# Patient Record
Sex: Female | Born: 1980 | Race: White | Hispanic: No | Marital: Married | Smoking: Never smoker
Health system: Southern US, Community
[De-identification: ages and names within clinical notes are randomized; demographics above are authoritative.]

## PROBLEM LIST (undated history)

## (undated) DIAGNOSIS — I10 Essential (primary) hypertension: Secondary | ICD-10-CM

## (undated) DIAGNOSIS — O09512 Supervision of elderly primigravida, second trimester: Secondary | ICD-10-CM

## (undated) DIAGNOSIS — E119 Type 2 diabetes mellitus without complications: Secondary | ICD-10-CM

## (undated) DIAGNOSIS — O1495 Unspecified pre-eclampsia, complicating the puerperium: Secondary | ICD-10-CM

## (undated) DIAGNOSIS — K219 Gastro-esophageal reflux disease without esophagitis: Secondary | ICD-10-CM

## (undated) HISTORY — DX: Type 2 diabetes mellitus without complications: E11.9

## (undated) HISTORY — DX: Gastro-esophageal reflux disease without esophagitis: K21.9

## (undated) HISTORY — DX: Essential (primary) hypertension: I10

## (undated) HISTORY — DX: Supervision of elderly primigravida, second trimester: O09.512

## (undated) HISTORY — PX: TYMPANOSTOMY TUBE PLACEMENT: SHX32

---

## 2017-06-02 LAB — HM PAP SMEAR: HM Pap smear: NEGATIVE

## 2017-07-05 ENCOUNTER — Inpatient Hospital Stay
Admission: RE | Admit: 2017-07-05 | Discharge: 2017-07-05 | Disposition: A | Discharge status: Home / Self Care | Payer: Self-pay | Source: Ambulatory Visit | Attending: *Deleted | Admitting: *Deleted

## 2017-07-05 ENCOUNTER — Other Ambulatory Visit: Payer: Self-pay | Admitting: *Deleted

## 2017-07-05 DIAGNOSIS — Z9289 Personal history of other medical treatment: Secondary | ICD-10-CM

## 2017-07-06 ENCOUNTER — Other Ambulatory Visit: Payer: Self-pay | Admitting: Women's Health

## 2017-07-06 DIAGNOSIS — Z1231 Encounter for screening mammogram for malignant neoplasm of breast: Secondary | ICD-10-CM

## 2017-07-08 ENCOUNTER — Ambulatory Visit
Admission: RE | Admit: 2017-07-08 | Discharge: 2017-07-08 | Disposition: A | Discharge status: Home / Self Care | Payer: 59 | Source: Ambulatory Visit | Attending: Women's Health | Admitting: Women's Health

## 2017-07-08 ENCOUNTER — Other Ambulatory Visit: Payer: Self-pay | Admitting: Women's Health

## 2017-07-08 DIAGNOSIS — Z1231 Encounter for screening mammogram for malignant neoplasm of breast: Secondary | ICD-10-CM

## 2017-08-04 ENCOUNTER — Encounter: Payer: Self-pay | Admitting: Radiology

## 2017-08-04 ENCOUNTER — Ambulatory Visit
Admission: RE | Admit: 2017-08-04 | Discharge: 2017-08-04 | Disposition: A | Discharge status: Home / Self Care | Payer: 59 | Source: Ambulatory Visit | Attending: Women's Health | Admitting: Women's Health

## 2017-08-04 DIAGNOSIS — Z1231 Encounter for screening mammogram for malignant neoplasm of breast: Secondary | ICD-10-CM | POA: Insufficient documentation

## 2017-08-31 ENCOUNTER — Ambulatory Visit (INDEPENDENT_AMBULATORY_CARE_PROVIDER_SITE_OTHER): Payer: 59 | Admitting: Primary Care

## 2017-08-31 ENCOUNTER — Encounter: Payer: Self-pay | Admitting: Primary Care

## 2017-08-31 VITALS — BP 122/76 | HR 87 | Temp 98.3°F | Wt 170.0 lb

## 2017-08-31 DIAGNOSIS — E119 Type 2 diabetes mellitus without complications: Secondary | ICD-10-CM | POA: Diagnosis not present

## 2017-08-31 DIAGNOSIS — I10 Essential (primary) hypertension: Secondary | ICD-10-CM | POA: Diagnosis not present

## 2017-08-31 MED ORDER — GLUCOSE BLOOD VI STRP: 1.0000 | ORAL_STRIP | Freq: Two times a day (BID) | 12 refills | Status: DC

## 2017-08-31 MED ORDER — LISINOPRIL 10 MG PO TABS: 10.0000 mg | ORAL_TABLET | Freq: Every day | ORAL | 1 refills | Status: DC

## 2017-08-31 MED ORDER — METFORMIN HCL 500 MG PO TABS: 500.0000 mg | ORAL_TABLET | Freq: Every day | ORAL | 1 refills | Status: DC

## 2017-08-31 NOTE — Progress Notes (Signed)
Subjective:    Patient ID: Rachel Rice, female    DOB: 1981-04-04, 37 y.o.   MRN: [id number]  HPI  [name] is a 37 year old female who presents today to establish care and discuss the problems mentioned below. Will obtain old records.   1) Type 2 Diabetes: Diagnosed in 2016. Currently managed on Metformin 500 mg once daily. Previously taking Metformin twice daily at one point but was decreased to one tablet once daily do to good glycemia control. She has to crush her metformin tablet as she has difficulty swallowing pills. Her last A1C was in November 2018 and she thinks it was 6.1 or 6.2.   She checks her blood sugar twice daily on average:  Fasting in the AM: 115-125 typically, recently has been 130-140's After lunch or after dinner: 115-125, recently running 130's-140's.   She has been under additional stress as she's recently moved, purchased a new home, and is adjusting to working from home.  2) Essential Hypertension: Currently managed on lisinopril 10 mg. She checks her BP at home and sees numbers of 120's/70-80's. She denies chest pain, dizziness, shortness of breath.   BP Readings from Last 3 Encounters:  08/31/17 122/76     Review of Systems  Constitutional: Negative for fatigue.  Respiratory: Negative for shortness of breath.   Cardiovascular: Negative for chest pain.  Neurological: Negative for dizziness, numbness and headaches.  Psychiatric/Behavioral:       Increased stress, handles well on her own.       Past Medical History:  Diagnosis Date  . Diabetes mellitus without complication ([place])   . GERD (gastroesophageal reflux disease)   . Hypertension      Social History   Socioeconomic History  . Marital status: Married    Spouse name: Not on file  . Number of children: Not on file  . Years of education: Not on file  . Highest education level: Not on file  Social Needs  . Financial resource strain: Not on file  . Food insecurity - worry: Not on  file  . Food insecurity - inability: Not on file  . Transportation needs - medical: Not on file  . Transportation needs - non-medical: Not on file  Occupational History  . Not on file  Tobacco Use  . Smoking status: Never Smoker  . Smokeless tobacco: Never Used  Substance and Sexual Activity  . Alcohol use: Yes    Comment: rare  . Drug use: No  . Sexual activity: Not on file  Other Topics Concern  . Not on file  Social History Narrative   Married.    No Children.   Claims adjuster.    Past Surgical History:  Procedure Laterality Date  . TYMPANOSTOMY TUBE PLACEMENT      Family History  Problem Relation Age of Onset  . Ovarian cancer Paternal Grandmother 74  . Hypertension Mother   . Hypertension Father   . Diabetes Father        secondary to agent orange exposure  . Hypertension Brother   . Heart disease Paternal Grandfather   . Hypertension Brother   . Breast cancer Neg Hx     Allergies  Allergen Reactions  . Contrast Media [Iodinated Diagnostic Agents] Hives  . Fluticasone Other (See Comments)    nosebleed nosebleed   . Latex Rash    Current Outpatient Medications on File Prior to Visit  Medication Sig Dispense Refill  . Blood Glucose Monitoring Suppl (ONE TOUCH ULTRA 2)  w/Device KIT by Does not apply route.    . Prenatal MV & Min w/FA-DHA (PRENATAL ADULT GUMMY/DHA/FA PO) Take 2 each by mouth daily.    . Probiotic Product (PROBIOTIC-10) CAPS Take 1 capsule by mouth daily.     No current facility-administered medications on file prior to visit.     BP 122/76   Pulse 87   Temp 98.3 F (36.8 C) (Oral)   Wt 170 lb (77.1 kg)   LMP 08/07/2017   SpO2 98%    Objective:   Physical Exam  Constitutional: She appears well-nourished.  Neck: Neck supple.  Cardiovascular: Normal rate and regular rhythm.  Pulmonary/Chest: Effort normal and breath sounds normal.  Skin: Skin is warm and dry.  Psychiatric: She has a normal mood and affect.            Assessment & Plan:

## 2017-08-31 NOTE — Assessment & Plan Note (Addendum)
Endorses A1C of 6.1 in November 2018. Managed on ACE.  Will repeat labs this June 2019, include lipid panel. Check foot exam next visit.  Continue to monitor blood sugars, work on stress reduction. Continue metformin 500 mg once daily for now.

## 2017-08-31 NOTE — Patient Instructions (Signed)
I sent refills for lisinopril and metformin to your pharmacy.  Please schedule a follow up appointment for June 2019, set up a lab appointment 1-2 days prior, make sure your fasting (no food 4 hours prior).   It was a pleasure to meet you today! Please don't hesitate to call or message me with any questions. Welcome to [facility]!

## 2017-08-31 NOTE — Assessment & Plan Note (Signed)
Stable in the office today, continue lisinopril 10 mg. Will obtain records for BMP. Repeat labs this June 2019.

## 2017-09-02 ENCOUNTER — Telehealth: Payer: Self-pay | Admitting: Primary Care

## 2017-09-02 NOTE — Telephone Encounter (Signed)
Rec'd from Dr. Delcie Rochyan Light & Dr. Donna Christenracy Papp forwarded 89 pages to Vernona Riegerlark Katherine NP

## 2017-09-08 ENCOUNTER — Encounter: Payer: Self-pay | Admitting: Primary Care

## 2017-09-09 ENCOUNTER — Ambulatory Visit: Payer: Self-pay | Admitting: *Deleted

## 2017-09-09 ENCOUNTER — Other Ambulatory Visit: Payer: Self-pay | Admitting: Primary Care

## 2017-09-09 DIAGNOSIS — I10 Essential (primary) hypertension: Secondary | ICD-10-CM

## 2017-09-09 MED ORDER — LABETALOL HCL 100 MG PO TABS: 100.0000 mg | ORAL_TABLET | Freq: Two times a day (BID) | ORAL | 0 refills | Status: DC

## 2017-09-09 NOTE — Telephone Encounter (Signed)
Please notify patient that we'll start her on labetalol 100 mg twice daily for her high blood pressure. Have her monitor her BP once daily, same arm, around the same time everyday.  I'll need to see her back in the office in 2 weeks for BP check, please schedule. Have her bring BP logs to her visit.

## 2017-09-09 NOTE — Telephone Encounter (Signed)
Patient just found out she is pregnant and she made a new patient appointment with her OB-GYN- they told her to stop her Lisinopril without starting her on an alternative. She has not had a new patient appointment yet. Patient BP yesterday was 129/99 and 121/92. Her BP today is 125/88 and now it is 147/105. She does report a slight headache.  Reason for Disposition . Ran out of BP medications  Answer Assessment - Initial Assessment Questions 1. BLOOD PRESSURE: "What is the blood pressure?" "Did you take at least two measurements 5 minutes apart?"     147/105 2. ONSET: "When did you take your blood pressure?"     today 3. HOW: "How did you obtain the blood pressure?" (e.g., visiting nurse, automatic home BP monitor)      4. HISTORY: "Do you have a history of high blood pressure?"    yes 5. MEDICATIONS: "Are you taking any medications for blood pressure?" "Have you missed any doses recently?"     Patient stopped blood pressure medication Wednesday- 2 doses 6. OTHER SYMPTOMS: "Do you have any symptoms?" (e.g., headache, chest pain, blurred vision, difficulty breathing, weakness)     Slight headache 7. PREGNANCY: "Is there any chance you are pregnant?" "When was your last menstrual period?"     yes  Protocols used: HIGH BLOOD PRESSURE-A-AH

## 2017-09-09 NOTE — Telephone Encounter (Signed)
I called the pt and she stated she was aware of the message below.  Has a follow up appt in 2 weeks.

## 2017-09-13 ENCOUNTER — Other Ambulatory Visit: Payer: Self-pay | Admitting: Primary Care

## 2017-09-13 MED ORDER — ACCU-CHEK AVIVA PLUS W/DEVICE KIT: PACK | 0 refills | Status: AC

## 2017-09-13 MED ORDER — ACCU-CHEK SOFT TOUCH LANCETS MISC: 5 refills | Status: DC

## 2017-09-13 MED ORDER — GLUCOSE BLOOD VI STRP: ORAL_STRIP | 12 refills | Status: DC

## 2017-09-13 NOTE — Telephone Encounter (Signed)
Received faxed refill request from the pharmacy that insurance prefer accu-chek  Send meter, test strips, and lancets to [facility]

## 2017-09-14 MED ORDER — ACCU-CHEK SOFTCLIX LANCETS MISC: 5 refills | Status: DC

## 2017-09-14 NOTE — Addendum Note (Signed)
Addended by: Tawnya CrookSAMBATH, Joselynne Killam on: 09/14/2017 11:04 AM   Modules accepted: Orders

## 2017-09-23 ENCOUNTER — Ambulatory Visit (INDEPENDENT_AMBULATORY_CARE_PROVIDER_SITE_OTHER): Payer: 59 | Admitting: Primary Care

## 2017-09-23 DIAGNOSIS — I10 Essential (primary) hypertension: Secondary | ICD-10-CM | POA: Diagnosis not present

## 2017-09-23 MED ORDER — LABETALOL HCL 100 MG PO TABS: 100.0000 mg | ORAL_TABLET | Freq: Two times a day (BID) | ORAL | 0 refills | Status: DC

## 2017-09-23 NOTE — Progress Notes (Signed)
Subjective:    Patient ID: Rachel Rice, female    DOB: [birth year], 37 y.o.   MRN: [id number]  HPI  Rachel Rice is a 37 year old female who presents today for follow up of hypertension.   Received phone call from patient on 09/09/17 with reports of recent pregnancy. She was discontinued from her lisinopril per GYN and started to notice elevated blood pressure readings of 147/105, 121/92. Also reported headaches. Given her history of hypertension and recent removal from anti-hypertensive she was treated with labetalol 100 mg BID and asked to follow up in our office.  BP Readings from Last 3 Encounters:  09/23/17 126/80  08/31/17 122/76   She's checking her BP at home and is getting readings of 110's-120's/70's-80's mostly. Her headaches have improved. She's been taking one tablet once daily over the past week as she started to notice lower readings. She has an appointment with her gynecologist next week.   Review of Systems  Eyes: Negative for visual disturbance.  Respiratory: Negative for shortness of breath.   Cardiovascular: Negative for chest pain.  Neurological: Negative for dizziness and headaches.       Past Medical History:  Diagnosis Date  . Diabetes mellitus without complication ([place])   . GERD (gastroesophageal reflux disease)   . Hypertension      Social History   Socioeconomic History  . Marital status: Married    Spouse name: Not on file  . Number of children: Not on file  . Years of education: Not on file  . Highest education level: Not on file  Occupational History  . Not on file  Social Needs  . Financial resource strain: Not on file  . Food insecurity:    Worry: Not on file    Inability: Not on file  . Transportation needs:    Medical: Not on file    Non-medical: Not on file  Tobacco Use  . Smoking status: Never Smoker  . Smokeless tobacco: Never Used  Substance and Sexual Activity  . Alcohol use: Yes    Comment: rare  . Drug use: No  .  Sexual activity: Not on file  Lifestyle  . Physical activity:    Days per week: Not on file    Minutes per session: Not on file  . Stress: Not on file  Relationships  . Social connections:    Talks on phone: Not on file    Gets together: Not on file    Attends religious service: Not on file    Active member of club or organization: Not on file    Attends meetings of clubs or organizations: Not on file    Relationship status: Not on file  . Intimate partner violence:    Fear of current or ex partner: Not on file    Emotionally abused: Not on file    Physically abused: Not on file    Forced sexual activity: Not on file  Other Topics Concern  . Not on file  Social History Narrative   Married.    No Children.   Claims adjuster.    Past Surgical History:  Procedure Laterality Date  . TYMPANOSTOMY TUBE PLACEMENT      Family History  Problem Relation Age of Onset  . Ovarian cancer Paternal Grandmother 67  . Hypertension Mother   . Hypertension Father   . Diabetes Father        secondary to agent orange exposure  . Hypertension Brother   . Heart disease  Paternal Grandfather   . Hypertension Brother   . Breast cancer Neg Hx     Allergies  Allergen Reactions  . Contrast Media [Iodinated Diagnostic Agents] Hives  . Fluticasone Other (See Comments)    nosebleed nosebleed   . Latex Rash    Current Outpatient Medications on File Prior to Visit  Medication Sig Dispense Refill  . ACCU-CHEK SOFTCLIX LANCETS lancets Use as instructed to test blood sugar 2 times daily 100 each 5  . Blood Glucose Monitoring Suppl (ACCU-CHEK AVIVA PLUS) w/Device KIT Use as instructed to test blood sugar daily 1 kit 0  . glucose blood (ACCU-CHEK AVIVA PLUS) test strip Use as instructed as instructed to test blood sugar 2 times daily 100 each 12  . metFORMIN (GLUCOPHAGE) 500 MG tablet Take 1 tablet (500 mg total) by mouth daily. 90 tablet 1  . Prenatal MV & Min w/FA-DHA (PRENATAL ADULT  GUMMY/DHA/FA PO) Take 2 each by mouth daily.    . Probiotic Product (PROBIOTIC-10) CAPS Take 1 capsule by mouth daily.     No current facility-administered medications on file prior to visit.     BP 126/80   Pulse 91   Temp 97.8 F (36.6 C) (Oral)   Wt 173 lb (78.5 kg)   SpO2 99%    Objective:   Physical Exam  Constitutional: She appears well-nourished.  Neck: Neck supple.  Cardiovascular: Normal rate and regular rhythm.  Pulmonary/Chest: Effort normal and breath sounds normal.  Skin: Skin is warm and dry.          Assessment & Plan:

## 2017-09-23 NOTE — Assessment & Plan Note (Signed)
Improved on labetalol 100 mg BID, is taking once daily now. Will continue once daily dosing to avoid hypotension. She will follow up with her gynecologist as scheduled.

## 2017-09-23 NOTE — Patient Instructions (Signed)
Continue taking labetalol once daily as discussed. Please notify your OB/GYN of this medication.  It was a pleasure to see you today! Congratulations!

## 2017-10-06 ENCOUNTER — Encounter: Payer: Self-pay | Admitting: *Deleted

## 2017-10-06 ENCOUNTER — Ambulatory Visit
Admission: RE | Admit: 2017-10-06 | Discharge: 2017-10-06 | Disposition: A | Discharge status: Home / Self Care | Payer: 59 | Source: Ambulatory Visit | Attending: Maternal & Fetal Medicine | Admitting: Maternal & Fetal Medicine

## 2017-10-06 VITALS — BP 131/94 | HR 96 | Temp 98.1°F | Ht 66.0 in | Wt 173.0 lb

## 2017-10-06 DIAGNOSIS — R112 Nausea with vomiting, unspecified: Secondary | ICD-10-CM | POA: Diagnosis not present

## 2017-10-06 DIAGNOSIS — O24111 Pre-existing diabetes mellitus, type 2, in pregnancy, first trimester: Secondary | ICD-10-CM | POA: Insufficient documentation

## 2017-10-06 DIAGNOSIS — I10 Essential (primary) hypertension: Secondary | ICD-10-CM

## 2017-10-06 DIAGNOSIS — Z3A01 Less than 8 weeks gestation of pregnancy: Secondary | ICD-10-CM | POA: Diagnosis not present

## 2017-10-06 DIAGNOSIS — Z7984 Long term (current) use of oral hypoglycemic drugs: Secondary | ICD-10-CM | POA: Insufficient documentation

## 2017-10-06 DIAGNOSIS — O24119 Pre-existing diabetes mellitus, type 2, in pregnancy, unspecified trimester: Secondary | ICD-10-CM | POA: Diagnosis not present

## 2017-10-06 DIAGNOSIS — O26891 Other specified pregnancy related conditions, first trimester: Secondary | ICD-10-CM | POA: Diagnosis not present

## 2017-10-06 DIAGNOSIS — O10011 Pre-existing essential hypertension complicating pregnancy, first trimester: Secondary | ICD-10-CM | POA: Insufficient documentation

## 2017-10-06 DIAGNOSIS — E119 Type 2 diabetes mellitus without complications: Secondary | ICD-10-CM

## 2017-10-06 DIAGNOSIS — O09521 Supervision of elderly multigravida, first trimester: Secondary | ICD-10-CM | POA: Insufficient documentation

## 2017-10-06 LAB — GLUCOSE, CAPILLARY: GLUCOSE-CAPILLARY: 163 mg/dL — AB (ref 65–99)

## 2017-10-06 NOTE — Progress Notes (Signed)
MFM Consultation  37 yo G1 at 8 weeks by LMP consistent with [place] on 09/27/17 (measurements c/w 7w 2d); EDD is 05/14/18.  She is referred for consultation due to Type 2 DM and chronic hypertension.  She reports diagnosis of DM 2 years ago during routine primary care visit surveillance.  She was placed on metformin '500mg'$  daily and has remained on that therapy.  She recently relocated from [place] (due to husband's work) and was without test strips and was unable to routinely check blood glucose values. She reports a HgbA1c of 6 in November, 2019. She denies history of end organ dysfunction. Chronic hypertension runs in her family (mother/father(due to agent orange)/brothers) and she was diagnosed in her 69s.  She reports negative renal artery ultrasounds and negative evaluations for other causes of hypertension.  She was previously on lisinopril and stopped when she became pregnant. She is here today with her husband.   Past Medical History:  Diagnosis Date  . Diabetes mellitus without complication ([place])   . GERD (gastroesophageal reflux disease)   . Hypertension     Past Surgical History:  Procedure Laterality Date  . TYMPANOSTOMY TUBE PLACEMENT      OB History  Gravida Para Term Preterm AB Living  1         0  SAB TAB Ectopic Multiple Live Births               # Outcome Date GA Lbr Len/2nd Weight Sex Delivery Anes PTL Lv  1 Current           no history of abnormal pap smear  Current Outpatient Medications on File Prior to Encounter  Medication Sig Dispense Refill  . ACCU-CHEK SOFTCLIX LANCETS lancets Use as instructed to test blood sugar 2 times daily 100 each 5  . Blood Glucose Monitoring Suppl (ACCU-CHEK AVIVA PLUS) w/Device KIT Use as instructed to test blood sugar daily 1 kit 0  . glucose blood (ACCU-CHEK AVIVA PLUS) test strip Use as instructed as instructed to test blood sugar 2 times daily 100 each 12  . labetalol (NORMODYNE) 100 MG tablet Take 1 tablet (100 mg total) by mouth 2  (two) times daily. For high blood pressure. (Patient taking differently: Take 100 mg by mouth once. For high blood pressure.) 180 tablet 0  . metFORMIN (GLUCOPHAGE) 500 MG tablet Take 1 tablet (500 mg total) by mouth daily. 90 tablet 1  . Prenatal MV & Min w/FA-DHA (PRENATAL ADULT GUMMY/DHA/FA PO) Take 2 each by mouth daily.    . Probiotic Product (PROBIOTIC-10) CAPS Take 1 capsule by mouth daily.     No current facility-administered medications on file prior to encounter.      Allergies  Allergen Reactions  . Contrast Media [Iodinated Diagnostic Agents] Hives  . Fluticasone Other (See Comments)    nosebleed nosebleed   . Latex Rash    Social history, married.  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   No tobacco use, etoh, or ilicit drugs.   Family History  Problem Relation Age of Onset  . Ovarian cancer Paternal Grandmother 17  . Hypertension Mother   . Hypertension Father   . Diabetes Father        secondary to agent orange exposure  . Hypertension Brother   . Heart disease Paternal Grandfather   . Hypertension Brother   . Breast cancer Neg Hx    Vitals:   10/06/17 0941  BP: (!) 131/94  Pulse: 96  Temp: 98.1 F (36.7 C)  SpO2: 99%   FHR 170s on BSUS   1. Hypertension in pregnancy. She is currently taking labetalol '100mg'$  daily.  She reports lower evening bps (asymptomatic, diastolic values in 03O systolic values 122-482) and had stopped evening dose for this reason. I advised her to continue the BID dosing.   We addressed concerns related to hypertensive disease include increased risks for superimposed preeclampsia, fetal growth restriction and other placenta mediated conditions such as placental abruption.  We discussed the lack of teratogenicity associated with ACE inhibitor exposure in the first trimester.  She was reassured with this information.   Recommendations --Continue labetalol  '100mg'$  bid---we addressed safety of labetalol use in pregnancy --Recommend low dose 'baby' aspirin after 12 weeks to decrease risk of superimposed preeclampsia.  Recommendations for surveillance as per type 2 DM recommendations (below)`.     2. Type 2 DM: we discussed some of the concerns related to diabetes in pregnancy associated with associated with poor glycemic control. Early fetal concerns are related to possible fetal malformations---cardiac malformations are the a major malformation associated with poor preconception glycemic control. The better the preconception glycemic control, the lower the risk for fetal birth defects. Her HgbA1c of 6 reflects good pregestational glycemic control.  Later in gestation, hyperglycemia may lead to fetal macrosomia, increased operative delivery/birth injury, intrauterine fetal demise and and post natal metabolic derangements/need for neonatal intensive care unit management of hypoglycemia. We addressed close monitoring and glycemic management to decrease these risks. Additionally, pregestational diabetes increases the risk for conditions such as preeclampsia.  Recommendations: --Baseline maternal evaluations should  include evaluation for thyroid function and DM related end organ dysfunction with: TSH, ECG, p:c ratio, opthalmology evaluation (she has routine eye exams every December, this will be sufficient surveillance) --Baseline labs obtained early in pregnancy should include: CBC, lfts, creatinine --We addressed pregnancy surveillance with 4x daily glucose monitoring (targets during pregnacy: fasting <100, 1h post prandial blood glucose values less than 130).  --continue metformin for now --Midtrimester detailed anatomic survey and monthly growth surveillance after 24-26 weeks, followed by weekly antenatal testing beginning at 32 weeks and twice weekly testing beginning at 34 weeks. --Delivery recommended by 37-39th week, unless clinically indicated sooner  (eg. If fetopathy present). --RTC for MFM consult in 4w, we can see her again sooner if needed based on glycemic control. She is scheduled for a follow up visit with you in ~2 weeks.   3. AMA We discussed concerns of aneuploidy (abnormal chromosome complement) with spontaneous conception We discussed our practice of recommending genetic counseling to all patients >=age 84.  We also addressed options for screening (first trimester screen ultrasound/nuchal translucency and maternal blood screen, or second trimester maternal blood screening for trisomy 21--down's syndrome, trisomy 21, and trisomy 57) or invasive testing (more definitive testing with [facility] in the first trimester or amniocentesis(second trimester) both of these tests are more invasive and carry an approximately 0.5% risk for pregnancy loss.    --She would like to have genetic counseling and is considering first trimester screening.  --We scheduled first trimester screening and genetic counseling in 4 weeks. She will also have glycemic review at that time/MFM consultation    4. Nausea/vomitting--she reports difficulty working due to symptoms. She has tried B6/unisom (ok for night--allows her to sleep) --I gave her a prescription for Zofran today.

## 2017-10-31 ENCOUNTER — Other Ambulatory Visit: Payer: Self-pay | Admitting: *Deleted

## 2017-10-31 DIAGNOSIS — Z36 Encounter for antenatal screening for chromosomal anomalies: Secondary | ICD-10-CM

## 2017-11-03 ENCOUNTER — Ambulatory Visit
Admission: RE | Admit: 2017-11-03 | Discharge: 2017-11-03 | Disposition: A | Discharge status: Home / Self Care | Payer: 59 | Source: Ambulatory Visit | Attending: Obstetrics and Gynecology | Admitting: Obstetrics and Gynecology

## 2017-11-03 ENCOUNTER — Ambulatory Visit (HOSPITAL_BASED_OUTPATIENT_CLINIC_OR_DEPARTMENT_OTHER)
Admission: RE | Admit: 2017-11-03 | Discharge: 2017-11-03 | Disposition: A | Discharge status: Home / Self Care | Payer: 59 | Source: Ambulatory Visit | Attending: Obstetrics and Gynecology | Admitting: Obstetrics and Gynecology

## 2017-11-03 VITALS — BP 127/88 | HR 81 | Wt 175.0 lb

## 2017-11-03 DIAGNOSIS — Z3A12 12 weeks gestation of pregnancy: Secondary | ICD-10-CM | POA: Diagnosis not present

## 2017-11-03 DIAGNOSIS — Z36 Encounter for antenatal screening for chromosomal anomalies: Secondary | ICD-10-CM | POA: Diagnosis not present

## 2017-11-03 DIAGNOSIS — O09512 Supervision of elderly primigravida, second trimester: Secondary | ICD-10-CM | POA: Insufficient documentation

## 2017-11-03 DIAGNOSIS — E119 Type 2 diabetes mellitus without complications: Secondary | ICD-10-CM

## 2017-11-03 DIAGNOSIS — O09511 Supervision of elderly primigravida, first trimester: Secondary | ICD-10-CM | POA: Diagnosis not present

## 2017-11-03 LAB — PROTEIN / CREATININE RATIO, URINE: Creatinine, Urine: 79 mg/dL

## 2017-11-03 LAB — GLUCOSE, CAPILLARY: Glucose-Capillary: 167 mg/dL — ABNORMAL HIGH (ref 65–99)

## 2017-11-03 MED ORDER — INSULIN DETEMIR 100 UNIT/ML ~~LOC~~ SOLN
10.0000 [IU] | Freq: Every day | SUBCUTANEOUS | 6 refills | Status: DC
Start: 2017-11-03 — End: 2018-05-24

## 2017-11-03 MED ORDER — INSULIN GLARGINE 100 UNIT/ML ~~LOC~~ SOLN: 10.0000 [IU] | Freq: Every day | SUBCUTANEOUS | 13 refills | Status: DC

## 2017-11-03 NOTE — Progress Notes (Addendum)
Referring Provider:  [facility] Length of Consultation: 40 minutes  Rachel Rice was referred to [facility] [place] for genetic counseling because of advanced maternal age.  The patient will be 37 years old at the time of delivery.  This note summarizes the information we discussed.    We explained that the chance of a chromosome abnormality increases with maternal age.  Chromosomes and examples of chromosome problems were reviewed.  Humans typically have 46 chromosomes in each cell, with half passed through each sperm and egg.  Any change in the number or structure of chromosomes can increase the risk of problems in the physical and mental development of a pregnancy.   Based upon age of the patient and the current gestational age, the chance of any chromosome abnormality was 1 in 73. The chance of Down syndrome, the most common chromosome problem associated with maternal age, was 1 in 84.  The risk of chromosome problems is in addition to the 3% general population risk for birth defects and mental retardation.  The greatest chance, of course, is that the baby would be born in good health.  We discussed the following prenatal screening and testing options for this pregnancy:  First trimester screening, which includes nuchal translucency ultrasound screen and first trimester maternal serum marker screening.  The nuchal translucency has approximately an 80% detection rate for Down syndrome and can be positive for other chromosome abnormalities as well as heart defects.  When combined with a maternal serum marker screening, the detection rate is up to 90% for Down syndrome and up to 97% for trisomy 18.     The chorionic villus sampling procedure is available for first trimester chromosome analysis.  This involves the withdrawal of a small amount of chorionic villi (tissue from the developing placenta).  Risk of pregnancy loss is estimated to be approximately 1 in 200 to 1 in  100 (0.5 to 1%).  There is approximately a 1% (1 in 100) chance that the CVS chromosome results will be unclear.  Chorionic villi cannot be tested for neural tube defects.     Maternal serum marker screening, a blood test that measures pregnancy proteins, can provide risk assessments for Down syndrome, trisomy 18, and open neural tube defects (spina bifida, anencephaly). Because it does not directly examine the fetus, it cannot positively diagnose or rule out these problems.  Targeted ultrasound uses high frequency sound waves to create an image of the developing fetus.  An ultrasound is often recommended as a routine means of evaluating the pregnancy.  It is also used to screen for fetal anatomy problems (for example, a heart defect) that might be suggestive of a chromosomal or other abnormality.   Amniocentesis involves the removal of a small amount of amniotic fluid from the sac surrounding the fetus with the use of a thin needle inserted through the maternal abdomen and uterus.  Ultrasound guidance is used throughout the procedure.  Fetal cells from amniotic fluid are directly evaluated and > 99.5% of chromosome problems and > 98% of open neural tube defects can be detected. This procedure is generally performed after the 15th week of pregnancy.  The main risks to this procedure include complications leading to miscarriage in less than 1 in 200 cases (0.5%).  We also reviewed the availability of cell free fetal DNA testing from maternal blood to determine whether or not the baby may have either Down syndrome, trisomy 60, or trisomy 11.  This test utilizes a  maternal blood sample and DNA sequencing technology to isolate circulating cell free fetal DNA from maternal plasma.  The fetal DNA can then be analyzed for DNA sequences that are derived from the three most common chromosomes involved in aneuploidy, chromosomes 13, 18, and 21.  If the overall amount of DNA is greater than the expected level for any  of these chromosomes, aneuploidy is suspected.  While we do not consider it a replacement for invasive testing and karyotype analysis, a negative result from this testing would be reassuring, though not a guarantee of a normal chromosome complement for the baby.  An abnormal result is certainly suggestive of an abnormal chromosome complement, though we would still recommend CVS or amniocentesis to confirm any findings from this testing.  Cystic Fibrosis and Spinal Muscular Atrophy (SMA) screening were also discussed with the patient. Both conditions are recessive, which means that both parents must be carriers in order to have a child with the disease.  Cystic fibrosis (CF) is one of the most common genetic conditions in persons of Caucasian ancestry.  This condition occurs in approximately 1 in 2,500 Caucasian persons and results in thickened secretions in the lungs, digestive, and reproductive systems.  For a baby to be at risk for having CF, both of the parents must be carriers for this condition.  Approximately 1 in 36 Caucasian persons is a carrier for CF.  Current carrier testing looks for the most common mutations in the gene for CF and can detect approximately 90% of carriers in the Caucasian population.  This means that the carrier screening can greatly reduce, but cannot eliminate, the chance for an individual to have a child with CF.  If an individual is found to be a carrier for CF, then carrier testing would be available for the partner. As part of [place]'s newborn screening profile, all babies born in the state of [place] will have a two-tier screening process.  Specimens are first tested to determine the concentration of immunoreactive trypsinogen (IRT).  The top 5% of specimens with the highest IRT values then undergo DNA testing using a panel of over 40 common CF mutations. SMA is a neurodegenerative disorder that leads to atrophy of skeletal muscle and overall weakness.  This  condition is also more prevalent in the Caucasian population, with 1 in 40-1 in 60 persons being a carrier and 1 in 6,000-1 in 10,000 children being affected.  There are multiple forms of the disease, with some causing death in infancy to other forms with survival into adulthood.  The genetics of SMA is complex, but carrier screening can detect up to 95% of carriers in the Caucasian population.  Similar to CF, a negative result can greatly reduce, but cannot eliminate, the chance to have a child with SMA.  The patient is of mixed [place] ancestry while the father of the baby is [place] and [place].  The patient has a normal MCV.  We also touched on carrier screening for conditions associated with [place] ancestry and [place] backgrounds.  They declined all carrier screening.  We obtained a detailed family history and pregnancy history.  Ms. Brothers reported that her maternal aunt is legally blind and may have some learning difficulties.  The family has indicated that there may be have been prenatal exposures which may have contributed to this, but that no details are known.  There are no other family members with similar concerns.  There may be different reasons for  vision impairment as well as learning differences, some of which may be inherited.  Without additional medical information, we cannot accurately determine the chance for other family members to have a similar condition.  There is also a significant family history of hypertension and diabetes in Ms. Linquist, her siblings and parents.  We reviewed that these conditions are most likely the result of a combination of genetic as well as lifestyle or environmental factors which may increase the chance for this child to have one of these conditions later in life.  The remainder of the family history is unremarkable for birth defects, developmental delays, recurrent pregnancy loss or known chromosome  abnormalities.  Ms. Kovacich stated that this is the first pregnancy for she and her husband.  She reported taking medications for her diabetes and hypertension (see MFM consultation note), but otherwise she has no health concerns or exposures in this pregnancy.  We discussed the recommendation for a fetal echocardiogram after [redacted] weeks gestation as well as a detailed anatomy ultrasound at [redacted] weeks gestation due to the increased risk for congenital anomalies in babies born to women with diabetes.    After consideration of the options, Ms. Hodgens elected to proceed with MaterniT21 PLUS with SCA.  She declined carrier screening for CF and SMA.  An ultrasound was performed at the time of the visit.  The gestational age was consistent with 12 weeks.  Fetal anatomy could not be assessed due to early gestational age.  Please refer to the ultrasound report for details of that study.  Ms. Detwiler was encouraged to call with questions or concerns.  We can be contacted at [phone].   Tests Ordered: MaterniT21 PLUS with SCA   Cherly Anderson, MS, CGC  Katrina Stack, MS, CGC performed an integral service incident to the physician's initial service.  I was physically present in the clinical area and was immediately available to render assistance.   Orpah C Issaih Kaus

## 2017-11-03 NOTE — Progress Notes (Signed)
[facility] Consultation Follow Up   Chief Complaint: diabetes in pregnancy  HPI: Rachel Rice is a 37 y.o. G1P0 at [redacted]w[redacted]d by LMP c/w [redacted]w[redacted]d [place] who presents in consultation from [facility] for management of diabetes in pregnancy.  Since her last visit on 4/11 with MFM, she has seen Endocrine to establish care and her metformin was increased to  BID.  Past Medical History: Patient  has a past medical history of Diabetes mellitus without complication (HCC), GERD (gastroesophageal reflux disease), and Hypertension.  Past Surgical History: She  has a past surgical history that includes Tympanostomy tube placement.  Obstetric History:  OB History    Gravida  1   Para      Term      Preterm      AB      Living  0     SAB      TAB      Ectopic      Multiple      Live Births             Vitals: BP 127/88 Pulse 81 Respirations 18 Weight 175lbs.  Medications:  Metformin  BID Labetalol  BID PNVs ASA  daily  Labs: Creatinine 0.6 TSH wnls A1C 6.5%  Review of blood sugars: FBS 122-167 2hr post breakfast 117-137 2hr post lunch 101-178 2hr post dinne 137-149  Asessement: 1. Type 2 diabetes mellitus without complication, without long-term current use of insulin (HCC)   2.      Advanced maternal age 18.      Hypertension  Plan: 1.  Encouraged compliance with 4x daily blood sugar testing (fasting and 2hr after meals).  Has not yet had EKG or p/c ratio - will order p/c ratio in clinic today.  Please order EKG.  Last eye exam was in December.    Given elevated FBS, will start 10U Lantus qhs.  Insulin teaching performed today.  Recommend detailed ultrasound for assessment of fetal anatomy at 18 weeks, fetal echo at 22 weeks and monthly [place] after 28 weeks with antenatal testing to start around 32 weeks.  Delivery between 37-39 weeks pending glycemic control.  2.  Continue ASA and labetalol for HTN - p/c ratio ordered  today.  3.  Genetic counseling (today) and first trimester [place] as well as cell free fetal DNA desired for AMA.  Follow up with MFM in 1 week to review BS - has endocrine follow up scheduled as well.   Total time spent with the patient was 30 minutes with greater than 50% spent in counseling and coordination of care. We appreciate this interesting consult and will be happy to be involved in the ongoing care of Ms. Mault in anyway her obstetricians desire.  Kirby Funk, MD Maternal-Fetal Medicine [facility]

## 2017-11-03 NOTE — Addendum Note (Signed)
Encounter addended by: Kirby Funk, MD on: 11/03/2017 1:11 PM  Actions taken: Order Reconciliation Section accessed, Home Medications modified, Order list changed

## 2017-11-09 LAB — MATERNIT21 PLUS CORE+SCA
Chromosome 13: NEGATIVE
Chromosome 18: NEGATIVE
Chromosome 21: NEGATIVE
Y CHROMOSOME: NOT DETECTED

## 2017-11-10 ENCOUNTER — Ambulatory Visit: Payer: 59

## 2017-11-10 ENCOUNTER — Ambulatory Visit
Admission: RE | Admit: 2017-11-10 | Discharge: 2017-11-10 | Disposition: A | Discharge status: Home / Self Care | Payer: 59 | Source: Ambulatory Visit | Attending: Maternal & Fetal Medicine | Admitting: Maternal & Fetal Medicine

## 2017-11-10 ENCOUNTER — Telehealth: Payer: Self-pay | Admitting: Obstetrics and Gynecology

## 2017-11-10 ENCOUNTER — Telehealth: Payer: Self-pay | Admitting: *Deleted

## 2017-11-10 VITALS — BP 134/85 | HR 97 | Temp 98.5°F | Resp 18 | Wt 178.4 lb

## 2017-11-10 DIAGNOSIS — Z3A01 Less than 8 weeks gestation of pregnancy: Secondary | ICD-10-CM | POA: Diagnosis not present

## 2017-11-10 DIAGNOSIS — O09511 Supervision of elderly primigravida, first trimester: Secondary | ICD-10-CM | POA: Diagnosis not present

## 2017-11-10 DIAGNOSIS — E119 Type 2 diabetes mellitus without complications: Secondary | ICD-10-CM | POA: Diagnosis not present

## 2017-11-10 DIAGNOSIS — Z7984 Long term (current) use of oral hypoglycemic drugs: Secondary | ICD-10-CM | POA: Diagnosis not present

## 2017-11-10 DIAGNOSIS — Z3A13 13 weeks gestation of pregnancy: Secondary | ICD-10-CM | POA: Diagnosis not present

## 2017-11-10 DIAGNOSIS — Z79899 Other long term (current) drug therapy: Secondary | ICD-10-CM | POA: Diagnosis not present

## 2017-11-10 DIAGNOSIS — O24119 Pre-existing diabetes mellitus, type 2, in pregnancy, unspecified trimester: Secondary | ICD-10-CM

## 2017-11-10 DIAGNOSIS — O161 Unspecified maternal hypertension, first trimester: Secondary | ICD-10-CM | POA: Insufficient documentation

## 2017-11-10 DIAGNOSIS — I10 Essential (primary) hypertension: Secondary | ICD-10-CM | POA: Diagnosis not present

## 2017-11-10 DIAGNOSIS — O24111 Pre-existing diabetes mellitus, type 2, in pregnancy, first trimester: Secondary | ICD-10-CM | POA: Insufficient documentation

## 2017-11-10 LAB — GLUCOSE, CAPILLARY: GLUCOSE-CAPILLARY: 167 mg/dL — AB (ref 65–99)

## 2017-11-10 NOTE — Progress Notes (Signed)
[facility] Consultation Follow Up   Chief Complaint: diabetes in pregnancy  HPI: [name] is a 37 y.o. G1P0 at [redacted]w[redacted]d by LMP c/w [redacted]w[redacted]d [place] who presents for follow-up consultation from [facility] for management of type 2 diabetes in pregnancy.  Her pregnancy is also complicated by AMA and HTN.  She was seen by endocrine this pregnancy to establish care.  Her metformin was increased to  BID.  She was seen at [facility] last week.  She was started on Levimir 10 units hs.    Today, she reports she is doing well  Review of blood sugars: FBS 107-123 with 6/6 > 95.  These are improved, but still outside of target range. 2hr post breakfast 110  with 0/1 > 120 Pre-lunch 110, 111 with 1/2 > 110 2hr post lunch 106, 128 with 1/1 > 120 2hr post dinner 147 with 1/1 > 120 Bedtime 133-154 with 3/3 > 110   Current medications:  Levimir 10 units hs Metformin  BID Labetalol  BID PNVs ASA  daily  Objective: Vitals: BP 134/85 (BP Location: Right Arm)   Pulse 97   Temp 98.5 F (36.9 C) (Oral)   Resp 18   Wt 178 lb 6.4 oz (80.9 kg)   LMP 08/07/2017   SpO2 99%   BMI 29.69 kg/m    BS today in the office 167  [place] for FHR - confirms rate in the 150s  Baseline labs: Creatinine 0.6 TSH wnls A1C 6.5%  11/03/2017 - P/C ratio was below the reportable range  5//02/2018 - Cell free fetal DNA - 46XX   Asessement: 1.      Type 2 DM 2.      Hypertension   3.      Advanced maternal age  Plan: 1.   Type 2 DM - Last eye exam was in December.   --  She was counseled today that  4x daily blood sugar testing (fasting and 2hr after meals) is sufficient for [place] to interpret her basal and bolus insulin needs. --  Her Levimir was increased to 20 units hs and called to her pharmacy. --  I have scheduled her to RTC in 1 week for BS review --  We recommend detailed ultrasound for assessment of fetal anatomy at 18 weeks, fetal echo at 22 weeks and  monthly [place] after 28 weeks with antenatal testing to start around 32 weeks.  Delivery between 37-39 weeks pending glycemic control. 2.  Hypertension - Normotensive. Is on  ASA and labetalol  --  EKG, especially given other risk factors,  has been recommended 3.  AMA - S/P genetic counseling, first trimester [place] and negative cell free fetal DNA  --  Anatomy US at 18 weeks --  Fetal surveillance as specified for type 2 DM  Follow up with MFM in 1 week to review BS - has endocrine follow up scheduled as well.  I spent 30 minutes in consultation, more than half of which was spent counseling the patient and coordinating her care.   Argentina Ponder, MD [facility]

## 2017-11-10 NOTE — Telephone Encounter (Signed)
The patient was informed of the results of her recent MaterniT21 testing which yielded NEGATIVE results.  The patient's specimen showed DNA consistent with two copies of chromosomes 21, 18 and 13.  The sensitivity for trisomy 21, trisomy 18 and trisomy 13 using this testing are reported as 99.1%, 99.9% and 91.7% respectively.  Thus, while the results of this testing are highly accurate, they are not considered diagnostic at this time.  Should more definitive information be desired, the patient may still consider amniocentesis.   As requested to know by the patient, sex chromosome analysis was included for this sample.  Results are consistent with a female fetus (no Y chromosome material detected). This is predicted with >99% accuracy.  A maternal serum AFP only should be considered if screening for neural tube defects is desired.  We may be reached at [phone] with any questions or concerns.   Zellie Jenning F. Bralen Wiltgen, MS, CGC   

## 2017-11-10 NOTE — Telephone Encounter (Signed)
Called [facility] pharmacy in [place] requesting Levimir 20 units at HS with 6 refills.

## 2017-11-14 ENCOUNTER — Other Ambulatory Visit: Payer: Self-pay

## 2017-11-14 ENCOUNTER — Encounter: Payer: Self-pay | Admitting: Emergency Medicine

## 2017-11-14 ENCOUNTER — Emergency Department
Admission: EM | Admit: 2017-11-14 | Discharge: 2017-11-14 | Disposition: A | Discharge status: Home / Self Care | Payer: 59 | Attending: Emergency Medicine | Admitting: Emergency Medicine

## 2017-11-14 ENCOUNTER — Emergency Department: Payer: 59

## 2017-11-14 DIAGNOSIS — Z3A14 14 weeks gestation of pregnancy: Secondary | ICD-10-CM | POA: Diagnosis not present

## 2017-11-14 DIAGNOSIS — O26892 Other specified pregnancy related conditions, second trimester: Secondary | ICD-10-CM | POA: Diagnosis not present

## 2017-11-14 DIAGNOSIS — O24112 Pre-existing diabetes mellitus, type 2, in pregnancy, second trimester: Secondary | ICD-10-CM | POA: Diagnosis not present

## 2017-11-14 DIAGNOSIS — Z79899 Other long term (current) drug therapy: Secondary | ICD-10-CM | POA: Insufficient documentation

## 2017-11-14 DIAGNOSIS — O2332 Infections of other parts of urinary tract in pregnancy, second trimester: Secondary | ICD-10-CM | POA: Diagnosis not present

## 2017-11-14 DIAGNOSIS — O10912 Unspecified pre-existing hypertension complicating pregnancy, second trimester: Secondary | ICD-10-CM | POA: Insufficient documentation

## 2017-11-14 DIAGNOSIS — Z7982 Long term (current) use of aspirin: Secondary | ICD-10-CM | POA: Insufficient documentation

## 2017-11-14 DIAGNOSIS — Z91040 Latex allergy status: Secondary | ICD-10-CM | POA: Insufficient documentation

## 2017-11-14 DIAGNOSIS — Z794 Long term (current) use of insulin: Secondary | ICD-10-CM | POA: Insufficient documentation

## 2017-11-14 DIAGNOSIS — R109 Unspecified abdominal pain: Secondary | ICD-10-CM

## 2017-11-14 DIAGNOSIS — R1031 Right lower quadrant pain: Secondary | ICD-10-CM | POA: Insufficient documentation

## 2017-11-14 DIAGNOSIS — N390 Urinary tract infection, site not specified: Secondary | ICD-10-CM

## 2017-11-14 LAB — COMPREHENSIVE METABOLIC PANEL
ALK PHOS: 45 U/L (ref 38–126)
ALT: 17 U/L (ref 14–54)
AST: 24 U/L (ref 15–41)
Albumin: 3.7 g/dL (ref 3.5–5.0)
Anion gap: 9 (ref 5–15)
BILIRUBIN TOTAL: 0.7 mg/dL (ref 0.3–1.2)
BUN: 13 mg/dL (ref 6–20)
CALCIUM: 8.9 mg/dL (ref 8.9–10.3)
CHLORIDE: 106 mmol/L (ref 101–111)
CO2: 19 mmol/L — ABNORMAL LOW (ref 22–32)
CREATININE: 0.65 mg/dL (ref 0.44–1.00)
Glucose, Bld: 125 mg/dL — ABNORMAL HIGH (ref 65–99)
Potassium: 3.7 mmol/L (ref 3.5–5.1)
Sodium: 134 mmol/L — ABNORMAL LOW (ref 135–145)
TOTAL PROTEIN: 7.5 g/dL (ref 6.5–8.1)

## 2017-11-14 LAB — URINALYSIS, COMPLETE (UACMP) WITH MICROSCOPIC
Bilirubin Urine: NEGATIVE
GLUCOSE, UA: NEGATIVE mg/dL
Hgb urine dipstick: NEGATIVE
Ketones, ur: NEGATIVE mg/dL
Leukocytes, UA: NEGATIVE
Nitrite: NEGATIVE
PROTEIN: NEGATIVE mg/dL
Specific Gravity, Urine: 1.009 (ref 1.005–1.030)
pH: 6 (ref 5.0–8.0)

## 2017-11-14 LAB — CBC
HCT: 37.2 % (ref 35.0–47.0)
Hemoglobin: 12.7 g/dL (ref 12.0–16.0)
MCH: 31.7 pg (ref 26.0–34.0)
MCHC: 34.2 g/dL (ref 32.0–36.0)
MCV: 92.8 fL (ref 80.0–100.0)
PLATELETS: 270 10*3/uL (ref 150–440)
RBC: 4.01 MIL/uL (ref 3.80–5.20)
RDW: 13.8 % (ref 11.5–14.5)
WBC: 11.7 10*3/uL — AB (ref 3.6–11.0)

## 2017-11-14 LAB — HCG, QUANTITATIVE, PREGNANCY: hCG, Beta Chain, Quant, S: 31024 m[IU]/mL — ABNORMAL HIGH (ref <5)

## 2017-11-14 LAB — LIPASE, BLOOD: Lipase: 29 U/L (ref 11–51)

## 2017-11-14 MED ORDER — CEPHALEXIN 500 MG PO CAPS
500.0000 mg | ORAL_CAPSULE | Freq: Once | ORAL | Status: AC
  Administered 2017-11-14: 500 mg via ORAL
  Filled 2017-11-14: qty 1

## 2017-11-14 MED ORDER — CEPHALEXIN 500 MG PO CAPS: 500.0000 mg | ORAL_CAPSULE | Freq: Two times a day (BID) | ORAL | 0 refills | Status: AC

## 2017-11-14 NOTE — ED Triage Notes (Signed)
Says [redacted] weeks pregnant.  Had sudden feeling of rupture on right side abd and then a warming sensaiotn.  Now with pain in right side abd.  No bleeding.

## 2017-11-14 NOTE — ED Notes (Signed)
Pt back from US

## 2017-11-14 NOTE — ED Triage Notes (Signed)
Felt a "rupture" sensation R lower abdomen approx 8am. States still feels mild discomfort. [redacted] weeks pregnant. No vag bleeding.

## 2017-11-14 NOTE — ED Provider Notes (Addendum)
[facility] Emergency Department Provider Note  ___________________________________________   First MD Initiated Contact with Patient 11/14/17 [phone]     (approximate)  I have reviewed the triage vital signs and the nursing notes.   HISTORY  Chief Complaint Abdominal Pain   HPI Rachel Rice is a 37 y.o. female with a history of diabetes and hypertension who is a G1, P0 at 14 weeks pregnancy at this time was presented to the emergency department with right lower quadrant pain.  Says the prior to arrival she felt a "rupture" and then a sensation of warmth that spread throughout her abdomen.  She denies any vaginal discharge or bleeding.  No burning with urination.  Pain does not radiate to her back.  Says the pain is a 3 out of 10 right now and to the right lower quadrant.  Denies any nausea or vomiting and ate yogurt for breakfast this morning.  Denies any diarrhea.  Says that she has been mildly constipated throughout her pregnancy.  Says that she has a known right-sided ovarian cyst as well.   Past Medical History:  Diagnosis Date  . Diabetes mellitus without complication ([facility])   . GERD (gastroesophageal reflux disease)   . Hypertension     Patient Active Problem List   Diagnosis Date Noted  . Advanced maternal age, 1st pregnancy, first trimester   . Type 2 diabetes mellitus without complication, without long-term current use of insulin ([facility]) 08/31/2017  . Essential hypertension 08/31/2017    Past Surgical History:  Procedure Laterality Date  . TYMPANOSTOMY TUBE PLACEMENT      Prior to Admission medications   Medication Sig Start Date End Date Taking? Authorizing Provider  ACCU-CHEK SOFTCLIX LANCETS lancets Use as instructed to test blood sugar 2 times daily 09/14/17  Yes Pleas Koch, NP  aspirin 81 MG chewable tablet Chew by mouth daily.   Yes [provider]  Blood Glucose Monitoring Suppl (ACCU-CHEK AVIVA PLUS) w/Device KIT Use as  instructed to test blood sugar daily 09/13/17  Yes Pleas Koch, NP  glucose blood (ACCU-CHEK AVIVA PLUS) test strip Use as instructed as instructed to test blood sugar 2 times daily 09/13/17  Yes Pleas Koch, NP  insulin detemir (LEVEMIR) 100 UNIT/ML injection Inject 0.1 mLs (10 Units total) into the skin at bedtime. Patient taking differently: Inject 20 Units into the skin at bedtime.  11/03/17 05/14/18 Yes Wynona Neat, MD  labetalol (NORMODYNE) 100 MG tablet Take 1 tablet (100 mg total) by mouth 2 (two) times daily. For high blood pressure. Patient taking differently: Take 100 mg by mouth once. Take 1 tablet ('100MG'$ ) by mouth every morning and  tablet ('50MG'$ ) by mouth every night at bedtime 09/23/17  Yes Pleas Koch, NP  Metformin HCl 500 MG/5ML SOLN Take 500 mg by mouth 2 (two) times daily.   Yes [provider]  Prenatal MV & Min w/FA-DHA (PRENATAL ADULT GUMMY/DHA/FA PO) Take 2 tablets by mouth daily.    Yes [provider]  Probiotic Product (PROBIOTIC-10) CAPS Take 1 capsule by mouth daily.   Yes [provider]    Allergies Contrast media [iodinated diagnostic agents]; Fluticasone; and Latex  Family History  Problem Relation Age of Onset  . Ovarian cancer Paternal Grandmother 15  . Hypertension Mother   . Hypertension Father   . Diabetes Father        secondary to agent orange exposure  . Hypertension Brother   . Heart disease Paternal Grandfather   .  Hypertension Brother   . Breast cancer Neg Hx     Social History Social History   Tobacco Use  . Smoking status: Never Smoker  . Smokeless tobacco: Never Used  Substance Use Topics  . Alcohol use: Not Currently    Comment: rare  . Drug use: No    Review of Systems  Constitutional: No fever/chills Eyes: No visual changes. ENT: No sore throat. Cardiovascular: Denies chest pain. Respiratory: Denies shortness of breath. Gastrointestinal:  No nausea, no vomiting.  No diarrhea.   No constipation. Genitourinary: Negative for dysuria. Musculoskeletal: Negative for back pain. Skin: Negative for rash. Neurological: Negative for headaches, focal weakness or numbness.   ____________________________________________   PHYSICAL EXAM:  VITAL SIGNS: ED Triage Vitals  Enc Vitals Group     BP 11/14/17 0909 (!) 149/93     Pulse Rate 11/14/17 0909 99     Resp 11/14/17 0909 20     Temp 11/14/17 0909 98.4 F (36.9 C)     Temp Source 11/14/17 0909 Oral     SpO2 11/14/17 0909 99 %     Weight 11/14/17 0904 180 lb (81.6 kg)     Height 11/14/17 0904 '5\' 5"'$  (1.651 m)     Head Circumference --      Peak Flow --      Pain Score 11/14/17 0903 3     Pain Loc --      Pain Edu? --      Excl. in [place]? --     Constitutional: Alert and oriented. Well appearing and in no acute distress. Eyes: Conjunctivae are normal.  Head: Atraumatic. Nose: No congestion/rhinnorhea. Mouth/Throat: Mucous membranes are moist.  Neck: No stridor.   Cardiovascular: Normal rate, regular rhythm. Grossly normal heart sounds.  Good peripheral circulation. Respiratory: Normal respiratory effort.  No retractions. Lungs CTAB. Gastrointestinal: Soft with minimal right lower quadrant tenderness to palpation without any rebound or guarding.  No distention. No CVA tenderness. Genitourinary: External exam grossly normal without any lesions.  Bimanual exam performed only with a cervical os that is closed.  No effacement.  Chaperoned by female tech, Crystal. Musculoskeletal: No lower extremity tenderness nor edema.  No joint effusions. Neurologic:  Normal speech and language. No gross focal neurologic deficits are appreciated. Skin:  Skin is warm, dry and intact. No rash noted. Psychiatric: Mood and affect are normal. Speech and behavior are normal.  ____________________________________________   LABS (all labs ordered are listed, but only abnormal results are displayed)  Labs Reviewed  COMPREHENSIVE  METABOLIC PANEL - Abnormal; Notable for the following components:      Result Value   Sodium 134 (*)    CO2 19 (*)    Glucose, Bld 125 (*)    All other components within normal limits  CBC - Abnormal; Notable for the following components:   WBC 11.7 (*)    All other components within normal limits  URINALYSIS, COMPLETE (UACMP) WITH MICROSCOPIC - Abnormal; Notable for the following components:   Color, Urine YELLOW (*)    APPearance CLEAR (*)    Bacteria, UA MANY (*)    All other components within normal limits  HCG, QUANTITATIVE, PREGNANCY - Abnormal; Notable for the following components:   hCG, Beta Chain, Quant, S 31,024 (*)    All other components within normal limits  LIPASE, BLOOD   ____________________________________________  EKG   ____________________________________________  RADIOLOGY  MRI of the abdomen which is unremarkable without any inflammatory findings.  Ultrasound OB with a single  IUP with fetal heart rate of 160 beats.  Cervix closed.  15 mm right corpus luteal cyst. ____________________________________________   PROCEDURES  Procedure(s) performed:   Procedures  Critical Care performed:   ____________________________________________   INITIAL IMPRESSION / ASSESSMENT AND PLAN / ED COURSE  Pertinent labs & imaging results that were available during my care of the patient were reviewed by me and considered in my medical decision making (see chart for details).  Differential diagnosis includes, but is not limited to, ovarian cyst, ovarian torsion, acute appendicitis, diverticulitis, urinary tract infection/pyelonephritis, endometriosis, bowel obstruction, colitis, renal colic, gastroenteritis, hernia, fibroids, endometriosis, pregnancy related pain including ectopic pregnancy, etc. Differential diagnosis includes, but is not limited to, threatened miscarriage, incomplete miscarriage, normal bleeding from an early trimester pregnancy, ectopic pregnancy,  , blighted ovum, vaginal/cervical trauma, subchorionic hemorrhage/hematoma, etc. As part of my medical decision making, I reviewed the following data within the electronic MEDICAL RECORD NUMBER Notes from outpatient visits.  ----------------------------------------- 2:30 PM on 11/14/2017 -----------------------------------------  Patient says that her symptoms have resolved.  Reexamined her abdomen and she is soft and nontender throughout.  Many bacteria in the urine.  Will send for culture and start the patient on Keflex.  She will be following up at the [facility].  She is understanding of the treatment plan and willing to comply.  We also reviewed the imaging.  No acute findings.  ____________________________________________   FINAL CLINICAL IMPRESSION(S) / ED DIAGNOSES  Right lower quadrant abdominal pain in the second trimester pregnancy..  UTI.    NEW MEDICATIONS STARTED DURING THIS VISIT:  New Prescriptions   No medications on file     Note:  This document was prepared using Dragon voice recognition software and may include unintentional dictation errors.     Orbie Pyo, MD 11/14/17 1431    Schaevitz, Randall An, MD 11/14/17 [phone]

## 2017-11-14 NOTE — ED Notes (Signed)
Patient transported to Ultrasound 

## 2017-11-14 NOTE — ED Notes (Signed)
Pt back from MRI 

## 2017-11-14 NOTE — ED Triage Notes (Signed)
Unable to get FHT in triage sitting in chair. Will defer til in patient room.

## 2017-11-15 LAB — URINE CULTURE: Culture: NO GROWTH

## 2017-11-17 ENCOUNTER — Ambulatory Visit
Admission: RE | Admit: 2017-11-17 | Discharge: 2017-11-17 | Disposition: A | Discharge status: Home / Self Care | Payer: 59 | Source: Ambulatory Visit | Attending: Maternal & Fetal Medicine | Admitting: Maternal & Fetal Medicine

## 2017-11-17 VITALS — BP 133/90 | HR 96 | Temp 98.0°F | Resp 20 | Wt 180.2 lb

## 2017-11-17 DIAGNOSIS — I10 Essential (primary) hypertension: Secondary | ICD-10-CM

## 2017-11-17 DIAGNOSIS — O24912 Unspecified diabetes mellitus in pregnancy, second trimester: Secondary | ICD-10-CM | POA: Diagnosis not present

## 2017-11-17 DIAGNOSIS — Z3A14 14 weeks gestation of pregnancy: Secondary | ICD-10-CM | POA: Insufficient documentation

## 2017-11-17 DIAGNOSIS — O09511 Supervision of elderly primigravida, first trimester: Secondary | ICD-10-CM

## 2017-11-17 DIAGNOSIS — O24119 Pre-existing diabetes mellitus, type 2, in pregnancy, unspecified trimester: Secondary | ICD-10-CM

## 2017-11-17 LAB — GLUCOSE, CAPILLARY: GLUCOSE-CAPILLARY: 134 mg/dL — AB (ref 65–99)

## 2017-11-17 NOTE — Progress Notes (Signed)
MFM Consultation  Follow up consultation to review blood glucose values.  37 yo G1 at 14w 4 day by LMP c/w 7w 2 day [place] presenting for Type 2 DM management.  She has history of chronic hypertension. She and her husband have been doing quite a bit of travelling over the last week (wedding, visiting family in [place]).  She did not have many postprandial blood glucose values but all were in range.   Her fastings, however, were consistently elevated and ranged from 95-low 100s (mostly 100s). She reports no snacking in evenings  Current regimen: Metformin  BId  Levamir 20u at HS.    Other meds:  Baby aspirin daily pnv Labetalol  bid  Vitals:   11/17/17 0917  BP: 133/90  Pulse: 96  Resp: 20  Temp: 98 F (36.7 C)  SpO2: 99%     BSUS--FHR 140s  Recommendation Increase evening levamir to 24u She does not anticipate much travel over the upcoming holiday weekend (memorial day) and will focus on recording more postprandial values and glycemic control She will call next week for review of blood glucose values and adjustments

## 2017-11-24 ENCOUNTER — Other Ambulatory Visit: Payer: Self-pay | Admitting: Primary Care

## 2017-11-24 DIAGNOSIS — E119 Type 2 diabetes mellitus without complications: Secondary | ICD-10-CM

## 2017-11-28 ENCOUNTER — Other Ambulatory Visit (INDEPENDENT_AMBULATORY_CARE_PROVIDER_SITE_OTHER): Payer: 59

## 2017-11-28 DIAGNOSIS — E119 Type 2 diabetes mellitus without complications: Secondary | ICD-10-CM

## 2017-11-28 LAB — LIPID PANEL
CHOLESTEROL: 163 mg/dL (ref 0–200)
HDL: 54.6 mg/dL (ref >39.00)
LDL Cholesterol: 73 mg/dL (ref 0–99)
NonHDL: 108.45
TRIGLYCERIDES: 176 mg/dL — AB (ref 0.0–149.0)
Total CHOL/HDL Ratio: 3
VLDL: 35.2 mg/dL (ref 0.0–40.0)

## 2017-11-28 LAB — HEMOGLOBIN A1C: HEMOGLOBIN A1C: 6.2 % (ref 4.6–6.5)

## 2017-12-01 ENCOUNTER — Ambulatory Visit (INDEPENDENT_AMBULATORY_CARE_PROVIDER_SITE_OTHER): Payer: 59 | Admitting: Primary Care

## 2017-12-01 ENCOUNTER — Encounter: Payer: Self-pay | Admitting: Primary Care

## 2017-12-01 DIAGNOSIS — O09511 Supervision of elderly primigravida, first trimester: Secondary | ICD-10-CM

## 2017-12-01 DIAGNOSIS — E119 Type 2 diabetes mellitus without complications: Secondary | ICD-10-CM | POA: Diagnosis not present

## 2017-12-01 DIAGNOSIS — I10 Essential (primary) hypertension: Secondary | ICD-10-CM

## 2017-12-01 NOTE — Assessment & Plan Note (Signed)
Stable in the office today, continue labetalol 100 mg BID.

## 2017-12-01 NOTE — Assessment & Plan Note (Signed)
Recent A1C of 6.2 from our labs. Discussed that she should be following with [facility] as they are prescribing Levmeir and Metformin. She agreed.  She will follow up with us as needed.   Commended her on an improved diet, recommended regular exercise as tolerated.

## 2017-12-01 NOTE — Patient Instructions (Signed)
[facility]   Your A1C yesterday was 6.2. Your cholesterol levels were normal.  Continue to work on your diet, try to get some exercise as tolerated.  Follow up with me as needed.  It was a pleasure to see you today!

## 2017-12-01 NOTE — Assessment & Plan Note (Signed)
[facility].

## 2017-12-01 NOTE — Progress Notes (Signed)
Subjective:    Patient ID: Rachel Rice, female    DOB: [birth year], 37 y.o.   MRN: [id number]  HPI  Rachel Rice is a 37 year old female who presents today for follow up.  She is currently following with endocrinology and now [facility]. [facility] has recently started Levemir and she is injecting 24 units daily. She is also managed on liquid metformin twice daily.  She's checking her glucose 3-4 times daily. Her goal glucose is less than 95 in morning and less than 120 in the afternoon/evening. AM: mid 90's Afternoon: 120's-130's Evening: 120-130's  She plans on starting regular exercise but this has been difficult given persistent nausea during her first trimester.   Diet currently consists of:  Breakfast: Thin bagel, fruit Lunch: Lean protein, spinach salad, chips Dinner: Lean protein, vegetable Snacks: Cheese, crackers, fruit, cottage cheese, yogurt Desserts: Occasional, 2-3 times weekly  Beverages: Chocolate milk, water, juice box (low carb)  BP Readings from Last 3 Encounters:  12/01/17 116/78  11/17/17 133/90  11/14/17 121/81   She's compliant to her labetalol 100 mg BID. She denies dizziness, visual changes.     Review of Systems  Eyes: Negative for visual disturbance.  Respiratory: Negative for shortness of breath.   Cardiovascular: Negative for chest pain.  Neurological: Negative for dizziness, weakness, numbness and headaches.       Past Medical History:  Diagnosis Date  . Diabetes mellitus without complication ([facility])   . GERD (gastroesophageal reflux disease)   . Hypertension      Social History   Socioeconomic History  . Marital status: Married    Spouse name: Not on file  . Number of children: Not on file  . Years of education: Not on file  . Highest education level: Not on file  Occupational History  . Not on file  Social Needs  . Financial resource strain: Not on file  . Food insecurity:    Worry: Not on file    Inability: Not on  file  . Transportation needs:    Medical: Not on file    Non-medical: Not on file  Tobacco Use  . Smoking status: Never Smoker  . Smokeless tobacco: Never Used  Substance and Sexual Activity  . Alcohol use: Not Currently    Comment: rare  . Drug use: No  . Sexual activity: Yes  Lifestyle  . Physical activity:    Days per week: Not on file    Minutes per session: Not on file  . Stress: Not on file  Relationships  . Social connections:    Talks on phone: Not on file    Gets together: Not on file    Attends religious service: Not on file    Active member of club or organization: Not on file    Attends meetings of clubs or organizations: Not on file    Relationship status: Not on file  . Intimate partner violence:    Fear of current or ex partner: Not on file    Emotionally abused: Not on file    Physically abused: Not on file    Forced sexual activity: Not on file  Other Topics Concern  . Not on file  Social History Narrative   Married.    No Children.   Claims adjuster.    Past Surgical History:  Procedure Laterality Date  . TYMPANOSTOMY TUBE PLACEMENT      Family History  Problem Relation Age of Onset  . Ovarian cancer Paternal Grandmother  77  . Hypertension Mother   . Hypertension Father   . Diabetes Father        secondary to agent orange exposure  . Hypertension Brother   . Heart disease Paternal Grandfather   . Hypertension Brother   . Breast cancer Neg Hx     Allergies  Allergen Reactions  . Contrast Media [Iodinated Diagnostic Agents] Hives  . Fluticasone Other (See Comments)    nosebleed nosebleed   . Latex Rash    Current Outpatient Medications on File Prior to Visit  Medication Sig Dispense Refill  . ACCU-CHEK SOFTCLIX LANCETS lancets Use as instructed to test blood sugar 2 times daily 100 each 5  . aspirin 81 MG chewable tablet Chew by mouth daily.    . Blood Glucose Monitoring Suppl (ACCU-CHEK AVIVA PLUS) w/Device KIT Use as instructed  to test blood sugar daily 1 kit 0  . glucose blood (ACCU-CHEK AVIVA PLUS) test strip Use as instructed as instructed to test blood sugar 2 times daily 100 each 12  . insulin detemir (LEVEMIR) 100 UNIT/ML injection Inject 0.1 mLs (10 Units total) into the skin at bedtime. (Patient taking differently: Inject 24 Units into the skin at bedtime. ) 3 mL 6  . labetalol (NORMODYNE) 100 MG tablet Take 1 tablet (100 mg total) by mouth 2 (two) times daily. For high blood pressure. (Patient taking differently: Take 100 mg by mouth once. Take 1 tablet ('100MG'$ ) by mouth every morning and  tablet ('50MG'$ ) by mouth every night at bedtime) 180 tablet 0  . Metformin HCl 500 MG/5ML SOLN Take 500 mg by mouth 2 (two) times daily.    . Prenatal MV & Min w/FA-DHA (PRENATAL ADULT GUMMY/DHA/FA PO) Take 2 tablets by mouth daily.     . Probiotic Product (PROBIOTIC-10) CAPS Take 1 capsule by mouth daily.     No current facility-administered medications on file prior to visit.     BP 116/78   Pulse 84   Temp 98.4 F (36.9 C) (Oral)   Ht '5\' 5"'$  (1.651 m)   Wt 183 lb (83 kg)   LMP 08/07/2017   SpO2 98%   BMI 30.45 kg/m    Objective:   Physical Exam  Constitutional: She appears well-nourished.  Neck: Neck supple.  Cardiovascular: Normal rate and regular rhythm.  Respiratory: Effort normal and breath sounds normal.  Skin: Skin is warm and dry.  Psychiatric: She has a normal mood and affect.           Assessment & Plan:

## 2017-12-12 ENCOUNTER — Other Ambulatory Visit: Payer: Self-pay | Admitting: *Deleted

## 2017-12-12 ENCOUNTER — Ambulatory Visit
Admission: RE | Admit: 2017-12-12 | Discharge: 2017-12-12 | Disposition: A | Discharge status: Home / Self Care | Payer: 59 | Source: Ambulatory Visit | Attending: Obstetrics & Gynecology | Admitting: Obstetrics & Gynecology

## 2017-12-12 VITALS — BP 126/80 | HR 96 | Temp 98.1°F | Resp 18 | Wt 186.6 lb

## 2017-12-12 DIAGNOSIS — E119 Type 2 diabetes mellitus without complications: Secondary | ICD-10-CM

## 2017-12-12 DIAGNOSIS — I10 Essential (primary) hypertension: Secondary | ICD-10-CM

## 2017-12-12 DIAGNOSIS — O09512 Supervision of elderly primigravida, second trimester: Secondary | ICD-10-CM | POA: Diagnosis not present

## 2017-12-12 DIAGNOSIS — Z7984 Long term (current) use of oral hypoglycemic drugs: Secondary | ICD-10-CM | POA: Diagnosis not present

## 2017-12-12 DIAGNOSIS — O24112 Pre-existing diabetes mellitus, type 2, in pregnancy, second trimester: Secondary | ICD-10-CM | POA: Insufficient documentation

## 2017-12-12 DIAGNOSIS — Z3A18 18 weeks gestation of pregnancy: Secondary | ICD-10-CM | POA: Insufficient documentation

## 2017-12-12 DIAGNOSIS — O10912 Unspecified pre-existing hypertension complicating pregnancy, second trimester: Secondary | ICD-10-CM | POA: Diagnosis not present

## 2017-12-12 LAB — GLUCOSE, CAPILLARY: Glucose-Capillary: 129 mg/dL — ABNORMAL HIGH (ref 65–99)

## 2017-12-12 NOTE — Progress Notes (Signed)
Fetal echo scheduled in [place] for 01/12/18 at 8 am.  EKG ordered today as well.

## 2017-12-12 NOTE — Progress Notes (Signed)
[facility] Followup Consultation  Rachel Rice is a 37 year-old G1 P0 at 5218 1/7 weeks who presents for followup consultation for the indication of type II diabetes. In addition, she is of advanced maternal age and has chronic hypertension.  She has been seen by endocrine this pregnancy and is on metformin 500 mg every 12 hours.  In addition, she is on Lantus 24 Units at bedtime.  She is also on a daily baby aspirin and labetalol.  She has no complaints.  See full MFM consult dated 10/06/17 and followup consultations dated 11/03/17 and 11/10/17.  She completed genetic counseling and elected for cell-free fetal DNA testing, which was normal.  Recommendations have been made for detailed anatomy scan and fetal echo. In addition, a fetal surveillance plan has been outlined.  No complaints.  Was on vacation last week so "cheated" some with her diet  Exam: Vitals:   12/12/17 1451  BP: 126/80  Pulse: 96  Resp: 18  Temp: 98.1 F (36.7 C)  SpO2: 100%     Blood sugars FS: 99, 94, 95, 104, 106, 91, 102, 104, 106, 100. 102, 122, 100 2hr PP Break: 112, 115, 117, 106, 141, 112 2hr PP lunch: 106, 120 2hr PP dinner: 121, 128   Labs (11/28/17): A1C 6.2%  -Continue metformin 500 mg in the morning but increase evening dose to 1000mg .  Continue Lantus 24 units at Bedtime. Continue to check sugars fasting and two hours postprandial.  Hopefully, the increase in evening metformin will help her Fastings and decrease the total amount of insulin she will need later in the pregnancy -[facility] is having us follow sugars but patient can followup there at any time. -[facility] -We will schedule a fetal echo to be done in 4-6 weeks -EKG today -RTC 2 weeks to review log  Rachel Rice, [place] A, MD

## 2017-12-22 ENCOUNTER — Ambulatory Visit
Admission: RE | Admit: 2017-12-22 | Discharge: 2017-12-22 | Disposition: A | Discharge status: Home / Self Care | Payer: 59 | Source: Ambulatory Visit | Attending: Maternal & Fetal Medicine | Admitting: Maternal & Fetal Medicine

## 2017-12-22 ENCOUNTER — Telehealth: Payer: Self-pay | Admitting: *Deleted

## 2017-12-22 VITALS — BP 125/87 | HR 92 | Temp 97.6°F | Resp 18 | Wt 184.6 lb

## 2017-12-22 DIAGNOSIS — O24112 Pre-existing diabetes mellitus, type 2, in pregnancy, second trimester: Secondary | ICD-10-CM | POA: Insufficient documentation

## 2017-12-22 DIAGNOSIS — I10 Essential (primary) hypertension: Secondary | ICD-10-CM

## 2017-12-22 DIAGNOSIS — O09512 Supervision of elderly primigravida, second trimester: Secondary | ICD-10-CM | POA: Diagnosis not present

## 2017-12-22 DIAGNOSIS — Z3A19 19 weeks gestation of pregnancy: Secondary | ICD-10-CM | POA: Insufficient documentation

## 2017-12-22 DIAGNOSIS — E119 Type 2 diabetes mellitus without complications: Secondary | ICD-10-CM

## 2017-12-22 LAB — GLUCOSE, CAPILLARY: GLUCOSE-CAPILLARY: 102 mg/dL — AB (ref 70–99)

## 2017-12-22 NOTE — Addendum Note (Signed)
Encounter addended by: Lady DeutscherJames, Skylin Kennerson, MD on: 12/22/2017 5:19 PM  Actions taken: Sign clinical note

## 2017-12-22 NOTE — Telephone Encounter (Signed)
Called in [facility]imir [facility] [place]

## 2017-12-22 NOTE — Progress Notes (Addendum)
[facility] Consultation Follow Up   Chief Complaint:diabetes in pregnancy  HPI:[name] a 37 y.o.G1P0 at 7355w4d by LMP c/w 6925w2d USwho [facility]. Her pregnancy is also complicated by AMA and HTN.  She has been seen by endocrine this pregnancy to establish care, but they have deferred further management of her diabetes to MFM.    Her  current regimen is: metformin 500mg  po qam and 1000 mg qpm.  Levimir 24 units hs.    Today, she reports restless legs, insomnia and stress at work.  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  [facility] and was advised to try Unisom.  She thinks it has helped.  Review of blood sugars: FBS 105-131 with 5/5 > 95.   2hr post breakfast 111, 113  with 0/2 > 120 Pre-lunch 99 with 0/1 > 110 2hr post lunch 110-132  with 2/5 > 120 2hr post dinner 115, 129 with 1/2 > 120   Other medications: Labetalol 100mg  BID PNVs ASA 81mg  daily  Objective: BP 125/87 (BP Location: Left Arm)   Pulse 92   Temp 97.6 F (36.4 C) (Oral)   Resp 18   Wt 184 lb 9.6 oz (83.7 kg)   LMP 08/07/2017   SpO2 98%   BMI 30.72 kg/m   [facility]  Baseline labs: Creatinine 0.6 TSH wnls A1C 6.5%  11/03/2017 - P/C ratio was below the reportable range  5//02/2018 - Cell free fetal DNA - 46XX  12/12/2017: Normal sinus rhythm with inverted T waves in AVR, III, v1 and v2 consistent with axis shift of pregnancy (The report reads: "Possible Left atrial enlargement, Septal infarct, age undetermined, Abnormal ECG" but was not interpreted in the context of pregnancy.)  RBS today 102  Asessement: 1.      Type 2 DM 2. Hypertension   3. Advanced maternal age  Plan: 1.   Type 2 DM - Last eye exam was in December.  --  Her Levimir was increased by 30% to 30 units hs and called to  her pharmacy. --  I have recommended she stay on the metformin because of her high degree of insulin resistance.  --  I have scheduled her to RTC in 2 weeks for BS review --  She has a fetal echo scheduled on 01/12/2018 at 22 weeks --  We recommend monthly US after 28 weeks with antenatal testing to start around 32 weeks. Twice weekly starting at 34 weeks. Delivery between 37-39 weeks pending glycemic control. 2. Hypertension - Normotensive. Is on  ASA and labetalol.  3. AMA - S/P genetic counseling, first trimester US and negative cell free fetal DNA .  Presumably, anatomy was normal. --  Anatomy US at 18 weeks --  Fetal surveillance as specified for type 2 DM   I spent 30 minutes in consultation, more than half of which was spent counseling the patient and coordinating her care.   Argentina PonderAndra H. Kristapher [place], MD [facility]

## 2017-12-26 ENCOUNTER — Inpatient Hospital Stay: Admission: RE | Admit: 2017-12-26 | Payer: 59 | Source: Ambulatory Visit

## 2017-12-31 ENCOUNTER — Other Ambulatory Visit: Payer: Self-pay | Admitting: Primary Care

## 2017-12-31 DIAGNOSIS — I10 Essential (primary) hypertension: Secondary | ICD-10-CM

## 2018-01-02 NOTE — Telephone Encounter (Signed)
Message left for patient to return my call.  Need to confirm with patient how she is taking labetalol (NORMODYNE) 100 MG tablet. Because according to Graylon GunningKate Clark's last progress note, patient is taking differently as prescribed.

## 2018-01-03 NOTE — Telephone Encounter (Signed)
Spoken to patient to confirm how patient is taking medication.  Take 1 tablet (100MG ) by mouth every morning and  tablet (50MG ) by mouth every night at bedtime. Okay to change sig?

## 2018-01-03 NOTE — Telephone Encounter (Signed)
Yes, will send new Rx

## 2018-01-05 ENCOUNTER — Ambulatory Visit
Admission: RE | Admit: 2018-01-05 | Discharge: 2018-01-05 | Disposition: A | Discharge status: Home / Self Care | Payer: 59 | Source: Ambulatory Visit | Attending: Maternal & Fetal Medicine | Admitting: Maternal & Fetal Medicine

## 2018-01-05 VITALS — BP 124/84 | HR 90 | Temp 97.9°F | Resp 18 | Wt 187.7 lb

## 2018-01-05 DIAGNOSIS — I10 Essential (primary) hypertension: Secondary | ICD-10-CM

## 2018-01-05 DIAGNOSIS — E119 Type 2 diabetes mellitus without complications: Secondary | ICD-10-CM | POA: Diagnosis not present

## 2018-01-05 DIAGNOSIS — O09512 Supervision of elderly primigravida, second trimester: Secondary | ICD-10-CM

## 2018-01-05 NOTE — Progress Notes (Signed)
[facility] Consultation Follow Up   Chief Complaint:diabetes in pregnancy  HPI:Ms. Rachel KennedySarah Samonais a 37 y.o.G1P0 at 5448w4d by LMP c/w 6886w2d USwho [facility].Her pregnancy is also complicated by AMA and HTN. She has been seen by endocrinethis pregnancyto establish care, but they have deferred further management of her diabetes to MFM.   Her current regimen is: metformin 1000 mg po qam (as of yesterday) and 1000 mg qpm.  Levimir 30 units hs.   No complaints today  Review of blood sugars: [id number] with 9/12 >95.  2hr post breakfast 119-127 with 2/3 >120 2hr post lunch 114-144  with 3/4 > 120 2hr post dinner 117, 123 with 1/2 > 120   Other medications: Labetalol 100mg  BID PNVs ASA 81mg  daily  Objective: BP 124/84 (BP Location: Right Arm)   Pulse 90   Temp 97.9 F (36.6 C) (Oral)   Resp 18   Wt 187 lb 11.2 oz (85.1 kg)   LMP 08/07/2017   SpO2 97%   BMI 31.23 kg/m    [facility]  Baseline labs: Creatinine 0.6 TSH wnls A1C 6.5%  11/03/2017 - P/C ratio was below the reportable range  5//02/2018 - Cell free fetal DNA - 46XX  12/12/2017: Normal sinus rhythm with inverted T waves in AVR, III, v1 and v2 consistent with axis shift of pregnancy (The report reads: "Possible Left atrial enlargement, Septal infarct, age undetermined, Abnormal ECG" but was not interpreted in the context of pregnancy.)   Asessement: 1. Type 2 DM 2. Hypertension 3. Advanced maternal age  Plan: 1. Type 2 DM - Last eye exam was in December. -- Her Levimir was increased by 30% to 40 units hs and called to her pharmacy. --  I have recommended she stay on the metformin because of her high degree of insulin resistance. She is now at the maximum dose in pregnancy of 1000 mg bid -- I have scheduled her to  RTC in 3 weeks for BS review -- She has a fetal echo scheduled on 01/12/2018 at 22 weeks --  We recommend monthly US after 28 weeks with antenatal testing to start around 32 weeks. Twice weekly starting at 34 weeks. Delivery between 37-39 weeks pending glycemic control. 2.Hypertension- Normotensive. Is on ASA and labetalol.  3.AMA -S/P genetic counseling,first trimester USand negativecell free fetal DNA.  S/P normal anatomy US. -- Fetal surveillance as specified for type 2 DM   I spent 30 minutes in consultation, more than half of which was spent counseling the patient and coordinating her care.   Argentina PonderAndra H. Celina Shiley, MD

## 2018-01-26 ENCOUNTER — Ambulatory Visit
Admission: RE | Admit: 2018-01-26 | Discharge: 2018-01-26 | Disposition: A | Discharge status: Home / Self Care | Payer: 59 | Source: Ambulatory Visit | Attending: Maternal & Fetal Medicine | Admitting: Maternal & Fetal Medicine

## 2018-01-26 VITALS — BP 128/80 | HR 92 | Temp 97.9°F | Resp 18 | Wt 191.4 lb

## 2018-01-26 DIAGNOSIS — Z7982 Long term (current) use of aspirin: Secondary | ICD-10-CM | POA: Insufficient documentation

## 2018-01-26 DIAGNOSIS — I10 Essential (primary) hypertension: Secondary | ICD-10-CM

## 2018-01-26 DIAGNOSIS — O09512 Supervision of elderly primigravida, second trimester: Secondary | ICD-10-CM | POA: Diagnosis not present

## 2018-01-26 DIAGNOSIS — Z794 Long term (current) use of insulin: Secondary | ICD-10-CM | POA: Insufficient documentation

## 2018-01-26 DIAGNOSIS — E119 Type 2 diabetes mellitus without complications: Secondary | ICD-10-CM

## 2018-01-26 DIAGNOSIS — Z3A24 24 weeks gestation of pregnancy: Secondary | ICD-10-CM | POA: Insufficient documentation

## 2018-01-26 DIAGNOSIS — O24112 Pre-existing diabetes mellitus, type 2, in pregnancy, second trimester: Secondary | ICD-10-CM | POA: Insufficient documentation

## 2018-01-26 DIAGNOSIS — O162 Unspecified maternal hypertension, second trimester: Secondary | ICD-10-CM | POA: Diagnosis not present

## 2018-01-26 DIAGNOSIS — E1165 Type 2 diabetes mellitus with hyperglycemia: Secondary | ICD-10-CM | POA: Diagnosis not present

## 2018-01-26 LAB — GLUCOSE, CAPILLARY: Glucose-Capillary: 157 mg/dL — ABNORMAL HIGH (ref 70–99)

## 2018-01-26 NOTE — Addendum Note (Signed)
Encounter addended by: Lady DeutscherJames, Michail Boyte, MD on: 01/26/2018 12:31 PM  Actions taken: Sign clinical note

## 2018-01-26 NOTE — Progress Notes (Addendum)
[facility] Consultation Follow-Up   Chief Complaint:diabetes in pregnancy  HPI:[name] a 37 y.o.G1P0 at 6676w4d by LMP c/w 6157w2d USwho [facility]for [facility].Her pregnancy is also complicated by AMA and HTN. Shehas beenseen by endocrinethis pregnancyto establish care, but they have deferred further management of her diabetes to MFM.  At her last visit here on 01/05/2018, her regimen was: metformin 1000 mgpo qam (as of yesterday) and 1000 mg qpm.  Levimir30units hs.   Her blood sugars were: [id number] with 9/12>95.  2hr post breakfast 119-127with 2/3>120 2hr post lunch 114-144with 3/4>120 2hr post dinner117, 123with 1/2>120  Her Levimir was increased 30% to 40 units hs.  [facility] and her blood sugars were still not optimally controlled.  Her metformin was discontinued and her regimen was changed to 23N/10R in the AM and 8N/8R in the PM.  Her blood sugars went from suboptimally controlled to uncontrolled and her doses were increased to 26N/12R in the AM and 12N/10R in the PM.  Her FBS this AM was 149.  Her blood sugars have been: [id number] with 5/5>95.  2hr post breakfast 134-151with 3/3>120 Pre-lunch 126 with 1/1 > 110 2hr post lunch 137, 139with 2/2>120 2hr post dinner122-156with 2/2>120 Hs BS 201  The patient has noticed other symptoms such as a dry mouth with the rising BS  Othermedications: Labetalol 100mg  BID PNVs ASA 81mg  daily  Objective: BP 128/80 (BP Location: Left Arm)   Pulse 92   Temp 97.9 F (36.6 C) (Oral)   Resp 18   Wt 191 lb 6.4 oz (86.8 kg)   LMP 08/07/2017   SpO2 98%   BMI 31.85 kg/m    RBS today was 157  [facility]  Bedside US - Normal FHR  Baseline labs: Creatinine 0.6 TSH wnls A1C 6.5%  11/03/2017 - P/C ratio was below  the reportable range  5//02/2018 - Cell free fetal DNA - 46XX  12/12/2017: Normal sinus rhythmwith inverted T waves in AVR, III, v1 and v2 consistent with axis shift of pregnancy (The report reads: "Possible Left atrial enlargement,Septal infarct, age undetermined,Abnormal ECG" but was not interpreted in the context of pregnancy.)   01/12/2018 - fetal echo normal  Asessement: 1. Type 2 DM now poorly controlled in the setting of significant insulin resistance 2. Hypertension 3. Advanced maternal age  Plan: 1. Type 2 DM now poorly controlled in the setting of significant insulin resistance - Last eye exam was in December. -- I recommended she discontinue her NPH and regular and resume her Levimir for her basal insulin.  A short acting insulin such as Novolog or Humalog can be added as mealtime bolus insulin depending on her next blood sugars. -- I have recommended she resume  the metformin because of her high degree of insulin resistance. She was at the maximum dose in pregnancy of 1000 mg bid before.  -- Given her degree of insulin resistance and the challenge regulating her blood sugars, I suggested she transfer her care to a tertiary care center [facility]  [facility] and I have arranged for this, with nutrition consult next week and endocrine consult in 2 weeks.   -- We previously recommended monthly [facility] after 28 weeks [facility] with weekly antenatal testing to start around 32 weeks.Twice weekly starting at 34 weeks.Delivery between 37-39 weeks pending glycemic control. 2.Hypertension- Normotensive. Is on ASA and labetalol. --  Fetal surveillance as specified for type 2 DM. 3.AMA -S/P genetic counseling,first trimester USand negativecell free fetal DNA. S/P normal anatomy US. -- Fetal surveillance as specified for type 2 DM.   I spent 30 minutes in consultation, more than half of which was spent  counseling the patient and coordinating her care.   Argentina Ponder, MD

## 2018-04-29 MED ORDER — METFORMIN HCL 500 MG PO TABS
500.00 | ORAL_TABLET | ORAL | Status: DC
Start: 2018-04-29 — End: 2018-04-29

## 2018-04-29 MED ORDER — CALCIUM CARBONATE ANTACID 750 MG PO CHEW
CHEWABLE_TABLET | ORAL | Status: DC
Start: ? — End: 2018-04-29

## 2018-04-29 MED ORDER — FERROUS SULFATE 324 (65 FE) MG PO TBEC
324.00 | DELAYED_RELEASE_TABLET | ORAL | Status: DC
Start: 2018-04-29 — End: 2018-04-29

## 2018-04-29 MED ORDER — FLUCONAZOLE 200 MG PO TABS
200.00 | ORAL_TABLET | ORAL | Status: DC
Start: 2018-04-29 — End: 2018-04-29

## 2018-04-29 MED ORDER — SIMETHICONE 80 MG PO CHEW
80.00 | CHEWABLE_TABLET | ORAL | Status: DC
Start: ? — End: 2018-04-29

## 2018-04-29 MED ORDER — PNV PRENATAL PLUS MULTIVITAMIN 27-1 MG PO TABS
1.00 | ORAL_TABLET | ORAL | Status: DC
Start: 2018-04-29 — End: 2018-04-29

## 2018-04-29 MED ORDER — ALUM & MAG HYDROXIDE-SIMETH 400-400-40 MG/5ML PO SUSP
15.00 | ORAL | Status: DC
Start: ? — End: 2018-04-29

## 2018-04-29 MED ORDER — VITAMIN D 50 MCG (2000 UT) PO TABS
2000.00 | ORAL_TABLET | ORAL | Status: DC
Start: 2018-04-29 — End: 2018-04-29

## 2018-04-29 MED ORDER — GLUCAGON HCL RDNA (DIAGNOSTIC) 1 MG IJ SOLR
1.00 | INTRAMUSCULAR | Status: DC
Start: ? — End: 2018-04-29

## 2018-04-29 MED ORDER — LABETALOL HCL 200 MG PO TABS
400.00 | ORAL_TABLET | ORAL | Status: DC
Start: 2018-04-29 — End: 2018-04-29

## 2018-04-29 MED ORDER — FIRST-MOUTHWASH BLM MT SUSP
5.00 | OROMUCOSAL | Status: DC
Start: ? — End: 2018-04-29

## 2018-04-29 MED ORDER — ONDANSETRON HCL 4 MG/2ML IJ SOLN
4.00 | INTRAMUSCULAR | Status: DC
Start: ? — End: 2018-04-29

## 2018-04-29 MED ORDER — ENOXAPARIN SODIUM 40 MG/0.4ML ~~LOC~~ SOLN
40.00 | SUBCUTANEOUS | Status: DC
Start: 2018-04-29 — End: 2018-04-29

## 2018-04-29 MED ORDER — VITAMIN C 500 MG PO TABS
500.00 | ORAL_TABLET | ORAL | Status: DC
Start: 2018-04-29 — End: 2018-04-29

## 2018-04-29 MED ORDER — DEXTROSE 50 % IV SOLN
12.50 | INTRAVENOUS | Status: DC
Start: ? — End: 2018-04-29

## 2018-04-29 MED ORDER — MENTHOL 7.6 MG MT LOZG
1.00 | LOZENGE | OROMUCOSAL | Status: DC
Start: ? — End: 2018-04-29

## 2018-04-29 MED ORDER — DOCUSATE SODIUM 100 MG PO CAPS
100.00 | ORAL_CAPSULE | ORAL | Status: DC
Start: 2018-04-29 — End: 2018-04-29

## 2018-04-30 ENCOUNTER — Other Ambulatory Visit: Payer: Self-pay

## 2018-04-30 ENCOUNTER — Emergency Department
Admission: EM | Admit: 2018-04-30 | Discharge: 2018-04-30 | Disposition: A | Discharge status: Other Acute Inpatient Hospital | Payer: 59 | Attending: Emergency Medicine | Admitting: Emergency Medicine

## 2018-04-30 ENCOUNTER — Encounter: Payer: Self-pay | Admitting: Intensive Care

## 2018-04-30 ENCOUNTER — Emergency Department: Payer: 59

## 2018-04-30 DIAGNOSIS — O9989 Other specified diseases and conditions complicating pregnancy, childbirth and the puerperium: Secondary | ICD-10-CM | POA: Insufficient documentation

## 2018-04-30 DIAGNOSIS — R1031 Right lower quadrant pain: Secondary | ICD-10-CM | POA: Insufficient documentation

## 2018-04-30 DIAGNOSIS — O9903 Anemia complicating the puerperium: Secondary | ICD-10-CM | POA: Insufficient documentation

## 2018-04-30 DIAGNOSIS — Z91040 Latex allergy status: Secondary | ICD-10-CM | POA: Insufficient documentation

## 2018-04-30 DIAGNOSIS — O2413 Pre-existing diabetes mellitus, type 2, in the puerperium: Secondary | ICD-10-CM | POA: Diagnosis not present

## 2018-04-30 DIAGNOSIS — O1495 Unspecified pre-eclampsia, complicating the puerperium: Secondary | ICD-10-CM | POA: Diagnosis not present

## 2018-04-30 DIAGNOSIS — D649 Anemia, unspecified: Secondary | ICD-10-CM | POA: Diagnosis not present

## 2018-04-30 LAB — COMPREHENSIVE METABOLIC PANEL
ALBUMIN: 3.1 g/dL — AB (ref 3.5–5.0)
ALT: 30 U/L (ref 0–44)
AST: 26 U/L (ref 15–41)
Alkaline Phosphatase: 104 U/L (ref 38–126)
Anion gap: 9 (ref 5–15)
BILIRUBIN TOTAL: 0.3 mg/dL (ref 0.3–1.2)
BUN: 12 mg/dL (ref 6–20)
CHLORIDE: 107 mmol/L (ref 98–111)
CO2: 23 mmol/L (ref 22–32)
Calcium: 8.7 mg/dL — ABNORMAL LOW (ref 8.9–10.3)
Creatinine, Ser: 0.69 mg/dL (ref 0.44–1.00)
GFR calc Af Amer: >60 mL/min (ref >60)
GFR calc non Af Amer: >60 mL/min (ref >60)
GLUCOSE: 159 mg/dL — AB (ref 70–99)
POTASSIUM: 4.2 mmol/L (ref 3.5–5.1)
Sodium: 139 mmol/L (ref 135–145)
Total Protein: 7 g/dL (ref 6.5–8.1)

## 2018-04-30 LAB — TYPE AND SCREEN
ABO/RH(D): A POS
ANTIBODY SCREEN: NEGATIVE

## 2018-04-30 LAB — CBC WITH DIFFERENTIAL/PLATELET
Abs Immature Granulocytes: 0.27 10*3/uL — ABNORMAL HIGH (ref 0.00–0.07)
BASOS ABS: 0 10*3/uL (ref 0.0–0.1)
Basophils Relative: 0 %
EOS ABS: 0.2 10*3/uL (ref 0.0–0.5)
Eosinophils Relative: 1 %
HEMATOCRIT: 30.4 % — AB (ref 36.0–46.0)
Hemoglobin: 9.7 g/dL — ABNORMAL LOW (ref 12.0–15.0)
IMMATURE GRANULOCYTES: 2 %
LYMPHS ABS: 1.3 10*3/uL (ref 0.7–4.0)
LYMPHS PCT: 8 %
MCH: 28.5 pg (ref 26.0–34.0)
MCHC: 31.9 g/dL (ref 30.0–36.0)
MCV: 89.4 fL (ref 80.0–100.0)
Monocytes Absolute: 1 10*3/uL (ref 0.1–1.0)
Monocytes Relative: 7 %
NEUTROS ABS: 12.5 10*3/uL — AB (ref 1.7–7.7)
NEUTROS PCT: 82 %
NRBC: 0 % (ref 0.0–0.2)
Platelets: 275 10*3/uL (ref 150–400)
RBC: 3.4 MIL/uL — ABNORMAL LOW (ref 3.87–5.11)
RDW: 15.9 % — ABNORMAL HIGH (ref 11.5–15.5)
WBC: 15.3 10*3/uL — ABNORMAL HIGH (ref 4.0–10.5)

## 2018-04-30 LAB — APTT: aPTT: 32 seconds (ref 24–36)

## 2018-04-30 LAB — PROTIME-INR
INR: 0.99
Prothrombin Time: 13 seconds (ref 11.4–15.2)

## 2018-04-30 LAB — MAGNESIUM: Magnesium: 1.8 mg/dL (ref 1.7–2.4)

## 2018-04-30 MED ORDER — HYDROMORPHONE HCL 1 MG/ML IJ SOLN
INTRAMUSCULAR | Status: AC
  Administered 2018-04-30: 1 mg via INTRAVENOUS
  Filled 2018-04-30: qty 1

## 2018-04-30 MED ORDER — ONDANSETRON 4 MG PO TBDP
4.0000 mg | ORAL_TABLET | Freq: Once | ORAL | Status: AC
  Administered 2018-04-30: 4 mg via ORAL

## 2018-04-30 MED ORDER — ONDANSETRON 4 MG PO TBDP
ORAL_TABLET | ORAL | Status: AC
  Administered 2018-04-30: 4 mg via ORAL
  Filled 2018-04-30: qty 1

## 2018-04-30 MED ORDER — HYDROMORPHONE HCL 1 MG/ML IJ SOLN
INTRAMUSCULAR | Status: AC
  Filled 2018-04-30: qty 1

## 2018-04-30 MED ORDER — HYDROMORPHONE HCL 1 MG/ML IJ SOLN
1.0000 mg | Freq: Once | INTRAMUSCULAR | Status: AC
  Administered 2018-04-30: 1 mg via INTRAVENOUS

## 2018-04-30 MED ORDER — SODIUM CHLORIDE 0.9 % IV BOLUS
500.0000 mL | Freq: Once | INTRAVENOUS | Status: AC
  Administered 2018-04-30: 500 mL via INTRAVENOUS

## 2018-04-30 MED ORDER — MAGNESIUM SULFATE 4 GM/100ML IV SOLN
4.0000 g | Freq: Once | INTRAVENOUS | Status: AC
  Administered 2018-04-30: 4 g via INTRAVENOUS
  Filled 2018-04-30: qty 100

## 2018-04-30 MED ORDER — HYDROMORPHONE HCL 1 MG/ML IJ SOLN
0.5000 mg | Freq: Once | INTRAMUSCULAR | Status: AC
  Administered 2018-04-30: 0.5 mg via INTRAVENOUS
  Filled 2018-04-30: qty 1

## 2018-04-30 MED ORDER — LABETALOL HCL 5 MG/ML IV SOLN
10.0000 mg | INTRAVENOUS | Status: AC | PRN
  Administered 2018-04-30 (×3): 10 mg via INTRAVENOUS
  Filled 2018-04-30 (×2): qty 4

## 2018-04-30 MED ORDER — BARIUM SULFATE 2.1 % PO SUSP: 900.0000 mL | ORAL | Status: DC

## 2018-04-30 MED ORDER — METOPROLOL TARTRATE 5 MG/5ML IV SOLN
10.0000 mg | Freq: Once | INTRAVENOUS | Status: DC
  Administered 2018-04-30: 10 mg via INTRAVENOUS
  Filled 2018-04-30: qty 10

## 2018-04-30 MED ORDER — MAGNESIUM SULFATE 40 G IN LACTATED RINGERS - SIMPLE
2.0000 g/h | INTRAVENOUS | Status: DC
  Filled 2018-04-30: qty 500

## 2018-04-30 NOTE — ED Triage Notes (Addendum)
Patient reports having c-section 10/28 and d/c early Saturday morning and this morning started having HTN and Right sided abdominal incision pain. Also c/o light headedness. Reports pre-eclampsia throughout pregnancy. Also having more vaginal bleeding today then when she was in the hospital. Takes labetalol

## 2018-04-30 NOTE — ED Notes (Signed)
Called [facility] for transport spoke to Onalee Hua will call back  [phone]

## 2018-04-30 NOTE — ED Notes (Signed)
Report  Given to Ann   Pt to be transferred to [facility]

## 2018-04-30 NOTE — ED Notes (Signed)
Pt  Had   Surgery  c section 6  Days  At [facility]  Pt has  Suture line   With steri strips  Pt   Developed  abd   Pain at the incision site  And it got worse over the day

## 2018-04-30 NOTE — ED Notes (Signed)
Pt returned from CT °

## 2018-04-30 NOTE — ED Notes (Signed)
Pt transported to CT by Kat 

## 2018-04-30 NOTE — ED Notes (Signed)
emtala reviewed by this RN 

## 2018-04-30 NOTE — ED Notes (Signed)
Pulse  Ox 90 after 1 mg dilaudid  Placed on nasal  02 at 2 l / min

## 2018-04-30 NOTE — ED Notes (Signed)
Called [facility] for tranfer spoke to [employer]

## 2018-04-30 NOTE — ED Provider Notes (Addendum)
[facility] Emergency Department Provider Note  ____________________________________________  Time seen: Approximately 5:02 PM  I have reviewed the triage vital signs and the nursing notes.   HISTORY  Chief Complaint Post-op Problem    HPI Rachel Rice is a 37 y.o. female with a history of DM 2 and HTN, advanced maternal age, G1P1 6 days postpartum after C-section induced for preeclampsia at 37 weeks and 1 day presenting with hypertension and right lower quadrant abdominal pain.  The patient reports that she has been monitoring her blood pressure at home, and today began to have significant hypertension with blood pressures in the 180s over 90s.  Additionally, she has noted right lower quadrant pain that is worse with positional changes or lying backwards.  She has not had any fever, vomiting (though she has had nausea), dysuria.  She reports LE edema that has improved.  No seizures.  Due to advanced maternal age with diabetes and hypertension, the patient has been managed for help with pregnancy by [facility].  Past Medical History:  Diagnosis Date  . Diabetes mellitus without complication (HCC)   . GERD (gastroesophageal reflux disease)   . Hypertension     Patient Active Problem List   Diagnosis Date Noted  . Advanced maternal age, 1st pregnancy, second trimester   . Type 2 diabetes mellitus without complication, without long-term current use of insulin (HCC) 08/31/2017  . Essential hypertension 08/31/2017    Past Surgical History:  Procedure Laterality Date  . CESAREAN SECTION    . TYMPANOSTOMY TUBE PLACEMENT      Current Outpatient Rx  . Order #: [id number] Class: Normal  . Order #: [id number] Class: Historical Med  . Order #: [id number] Class: Normal  . Order #: [id number] Class: Historical Med  . Order #: [id number] Class: Normal  . Order #: [id number] Class: Fax  . Order #: [id number] Class: Historical Med  . Order #: [id number] Class: Normal  .  Order #: [id number] Class: Historical Med  . Order #: [id number] Class: Historical Med  . Order #: [id number] Class: Historical Med    Allergies Contrast media [iodinated diagnostic agents]; Fluticasone; and Latex  Family History  Problem Relation Age of Onset  . Ovarian cancer Paternal Grandmother 59  . Hypertension Mother   . Hypertension Father   . Diabetes Father        secondary to agent orange exposure  . Hypertension Brother   . Heart disease Paternal Grandfather   . Hypertension Brother   . Breast cancer Neg Hx     Social History Social History   Tobacco Use  . Smoking status: Never Smoker  . Smokeless tobacco: Never Used  Substance Use Topics  . Alcohol use: Not Currently    Comment: rare  . Drug use: No    Review of Systems Constitutional: No fever/chills.  No lightheadedness or syncope. Eyes: No visual changes.  No blurred or double vision. ENT: No sore throat. No congestion or rhinorrhea. Cardiovascular: Denies chest pain. Denies palpitations. Respiratory: Denies shortness of breath.  No cough. Gastrointestinal: Positive right lower quadrant abdominal pain.  No nausea, no vomiting.  No diarrhea.  No constipation. Genitourinary: Negative for dysuria.  Status post C-section 04/24/2018. Musculoskeletal: Negative for back pain. Skin: Negative for rash. No swelling, drainage from c/s incision site. Neurological: Negative for headaches. No focal numbness, tingling or weakness.     ____________________________________________   PHYSICAL EXAM:  VITAL SIGNS: ED Triage Vitals [04/30/18 1356]  Enc Vitals Group     BP (!) 191/95     Pulse  Rate 95     Resp 18     Temp 99.4 F (37.4 C)     Temp Source Oral     SpO2 98 %     Weight 195 lb (88.5 kg)     Height 5\' 5"  (1.651 m)     Head Circumference      Peak Flow      Pain Score 10     Pain Loc      Pain Edu?      Excl. in GC?     Constitutional: Alert and oriented. Answers questions appropriately.   Uncomfortable appearing. Eyes: Conjunctivae are normal.  EOMI. No scleral icterus. Head: Atraumatic. Nose: No congestion/rhinnorhea. Mouth/Throat: Mucous membranes are moist.  Neck: No stridor.  Supple.  No meningismus.  No JVD. Cardiovascular: Normal rate, regular rhythm. No murmurs, rubs or gallops.  Respiratory: Normal respiratory effort.  No accessory muscle use or retractions. Lungs CTAB.  No wheezes, rales or ronchi. Gastrointestinal: Soft, and mildly distended.  The patient has a low transverse C-section incisional site that is approximately 9 inches long, covered with Steri-Strips, without any surrounding erythema, swelling, fluctuance or purulent discharge.  She does have tenderness to palpation on the right side of the incisional site and just above that area in the pelvis.  No guarding or rebound.  No peritoneal signs. Musculoskeletal: Lateral pitting lower extremity to the proximal tibial shafts, symmetric bilaterally.  Neurologic:  A&Ox3.  Speech is clear.  Face and smile are symmetric.  EOMI.  Moves all extremities well. Skin:  Skin is warm, dry and intact. No rash noted. Psychiatric: Mood and affect are normal. Speech and behavior are normal.  Normal judgement.  ____________________________________________   LABS (all labs ordered are listed, but only abnormal results are displayed)  Labs Reviewed  CBC WITH DIFFERENTIAL/PLATELET - Abnormal; Notable for the following components:      Result Value   WBC 15.3 (*)    RBC 3.40 (*)    Hemoglobin 9.7 (*)    HCT 30.4 (*)    RDW 15.9 (*)    Neutro Abs 12.5 (*)    Abs Immature Granulocytes 0.27 (*)    All other components within normal limits  COMPREHENSIVE METABOLIC PANEL - Abnormal; Notable for the following components:   Glucose, Bld 159 (*)    Calcium 8.7 (*)    Albumin 3.1 (*)    All other components within normal limits  MAGNESIUM  PROTIME-INR  APTT  TYPE AND SCREEN    ____________________________________________  EKG  ED ECG REPORT I, Anne-Caroline Sharma Covert, the attending physician, personally viewed and interpreted this ECG.   Date: 04/30/2018  EKG Time: 203  Rate: 93  Rhythm: normal sinus rhythm  Axis: leftward  Intervals:none  ST&T Change: No STEMI  ____________________________________________  RADIOLOGY  No results found.  ____________________________________________   PROCEDURES  Procedure(s) performed: None  Procedures  Critical Care performed: Yes, see critical care note(s) ____________________________________________   INITIAL IMPRESSION / ASSESSMENT AND PLAN / ED COURSE  Pertinent labs & imaging results that were available during my care of the patient were reviewed by me and considered in my medical decision making (see chart for details).  36 y.o. female with a history of DM and HTN, G1, P1 6 days postpartum after C-section for preeclampsia presenting with hypertension, right lower quadrant pain.  Overall, I am concerned about the patient's blood pressure with a systolic as high as the 180s and diastolic blood pressure in the 90s.  At  this time, the patient is not having seizures, and has not had any neurologic deficits, but we will aggressively manage her blood pressure with metoprolol, and she will also receive a magnesium bolus and continuous infusion.  Her abdominal examination does show some right lower quadrant pain and a CT has been ordered.  Unfortunately, she has a contrast dye allergy which will limit her ability to get IV contrast and I am planning transfer so we will do a noncontrasted scan at this time.  I am concerned about possible infectious etiology, including abscess.  She may also be having pain from scarring or postoperative pain.  I have already consulted the [facility] team for transfer and I am awaiting a call back.  ----------------------------------------- 6:14 PM on  04/30/2018 ----------------------------------------- The patient has responded well to metoprolol and labetalol and is maintaining blood pressures within our goals of a systolic less than 160 and diastolics under 110.  She continues to be asymptomatic from a blood pressure standpoint.  She does have ongoing right lower quadrant discomfort, which slightly improved with Dilaudid.  I will re-dose her with a higher dose.  She has undergone CT imaging, and I do not have the report back at this time.  The patient has been accepted to [facility] for transfer at this time.  ----------------------------------------- 7:50 PM on 04/30/2018 -----------------------------------------  Patient's CT scan does not show any fluid collections or other acute abnormalities.  Her blood pressure has continued to improve with labetalol and good pain control.  Awaiting transfer at this time.   CRITICAL CARE Performed by: Rockne Menghini   Total critical care time: 45 minutes  Critical care time was exclusive of separately billable procedures and treating other patients.  Critical care was necessary to treat or prevent imminent or life-threatening deterioration.  Critical care was time spent personally by me on the following activities: development of treatment plan with patient and/or surrogate as well as nursing, discussions with consultants, evaluation of patient's response to treatment, examination of patient, obtaining history from patient or surrogate, ordering and performing treatments and interventions, ordering and review of laboratory studies, ordering and review of radiographic studies, pulse oximetry and re-evaluation of patient's condition.   ____________________________________________  FINAL CLINICAL IMPRESSION(S) / ED DIAGNOSES  Final diagnoses:  Pre-eclampsia in postpartum period  Right lower quadrant pain  Anemia, unspecified type         NEW MEDICATIONS STARTED DURING THIS  VISIT:  New Prescriptions   No medications on file      Rockne Menghini, MD 04/30/18 1716    Rockne Menghini, MD 04/30/18 1816    Rockne Menghini, MD 04/30/18 1950

## 2018-04-30 NOTE — ED Notes (Signed)
Images powershared to [facility]

## 2018-04-30 NOTE — ED Notes (Signed)
Report called to Modena Nunnery, Charity fundraiser for [id number] at [facility].

## 2018-04-30 NOTE — ED Notes (Signed)
[facility] and safety inspector here to transport pt. Report given to RN.

## 2018-04-30 NOTE — ED Notes (Signed)
First Nurse Note: Pt to ED via POV, pt had C-Section last week at [facility]. Pt had Pre-eclampsia. Pt states that her blood pressure has spiked again and that she is having incisional pain on the right side. Pt denies any drainage from the incision. Pt states that her blood pressure was 176/96 this morning. Pt is taking Labetalol.

## 2018-04-30 NOTE — ED Notes (Signed)
Pt resting with family at bedside

## 2018-05-02 MED ORDER — METFORMIN HCL 500 MG PO TABS
500.00 | ORAL_TABLET | ORAL | Status: DC
Start: ? — End: 2018-05-02

## 2018-05-02 MED ORDER — ACETAMINOPHEN 160 MG/5ML PO SUSP
975.00 | ORAL | Status: DC
Start: 2018-05-02 — End: 2018-05-02

## 2018-05-02 MED ORDER — ENOXAPARIN SODIUM 40 MG/0.4ML ~~LOC~~ SOLN
40.00 | SUBCUTANEOUS | Status: DC
Start: 2018-05-03 — End: 2018-05-02

## 2018-05-02 MED ORDER — LABETALOL HCL 200 MG PO TABS
400.00 | ORAL_TABLET | ORAL | Status: DC
Start: 2018-05-02 — End: 2018-05-02

## 2018-05-02 MED ORDER — ONDANSETRON HCL 4 MG/2ML IJ SOLN
4.00 | INTRAMUSCULAR | Status: DC
Start: ? — End: 2018-05-02

## 2018-05-02 MED ORDER — FERROUS SULFATE 324 (65 FE) MG PO TBEC
324.00 | DELAYED_RELEASE_TABLET | ORAL | Status: DC
Start: 2018-05-03 — End: 2018-05-02

## 2018-05-02 MED ORDER — OXYCODONE HCL 5 MG/5ML PO SOLN
5.00 | ORAL | Status: DC
Start: ? — End: 2018-05-02

## 2018-05-02 MED ORDER — LIDOCAINE 5 % EX PTCH
2.00 | MEDICATED_PATCH | CUTANEOUS | Status: DC
Start: 2018-05-03 — End: 2018-05-02

## 2018-05-02 MED ORDER — IBUPROFEN 100 MG/5ML PO SUSP
600.00 | ORAL | Status: DC
Start: 2018-05-02 — End: 2018-05-02

## 2018-05-02 MED ORDER — FLUCONAZOLE 200 MG PO TABS
200.00 | ORAL_TABLET | ORAL | Status: DC
Start: 2018-05-02 — End: 2018-05-02

## 2018-05-02 MED ORDER — DOCUSATE SODIUM 100 MG PO CAPS
100.00 | ORAL_CAPSULE | ORAL | Status: DC
Start: 2018-05-03 — End: 2018-05-02

## 2018-05-02 MED ORDER — SIMETHICONE 80 MG PO CHEW
80.00 | CHEWABLE_TABLET | ORAL | Status: DC
Start: ? — End: 2018-05-02

## 2018-05-05 MED ORDER — SIMETHICONE 80 MG PO CHEW
80.00 | CHEWABLE_TABLET | ORAL | Status: DC
Start: ? — End: 2018-05-05

## 2018-05-05 MED ORDER — IBUPROFEN 100 MG/5ML PO SUSP
600.00 | ORAL | Status: DC
Start: ? — End: 2018-05-05

## 2018-05-05 MED ORDER — FERROUS SULFATE 324 (65 FE) MG PO TBEC
324.00 | DELAYED_RELEASE_TABLET | ORAL | Status: DC
Start: 2018-05-06 — End: 2018-05-05

## 2018-05-05 MED ORDER — ENOXAPARIN SODIUM 40 MG/0.4ML ~~LOC~~ SOLN
40.00 | SUBCUTANEOUS | Status: DC
Start: 2018-05-06 — End: 2018-05-05

## 2018-05-05 MED ORDER — LIDOCAINE 5 % EX PTCH
1.00 | MEDICATED_PATCH | CUTANEOUS | Status: DC
Start: 2018-05-06 — End: 2018-05-05

## 2018-05-05 MED ORDER — OXYCODONE HCL 5 MG/5ML PO SOLN
5.00 | ORAL | Status: DC
Start: ? — End: 2018-05-05

## 2018-05-05 MED ORDER — GENERIC EXTERNAL MEDICATION
600.00 | Status: DC
Start: 2018-05-05 — End: 2018-05-05

## 2018-05-05 MED ORDER — PHENAZOPYRIDINE HCL 200 MG PO TABS
200.00 | ORAL_TABLET | ORAL | Status: DC
Start: 2018-05-05 — End: 2018-05-05

## 2018-05-05 MED ORDER — DOCUSATE SODIUM 100 MG PO CAPS
100.00 | ORAL_CAPSULE | ORAL | Status: DC
Start: 2018-05-06 — End: 2018-05-05

## 2018-05-05 MED ORDER — GENERIC EXTERNAL MEDICATION
1.00 | Status: DC
Start: 2018-05-05 — End: 2018-05-05

## 2018-05-05 MED ORDER — GENERIC EXTERNAL MEDICATION
650.00 | Status: DC
Start: ? — End: 2018-05-05

## 2018-05-05 MED ORDER — METFORMIN HCL 500 MG PO TABS
500.00 | ORAL_TABLET | ORAL | Status: DC
Start: 2018-05-06 — End: 2018-05-05

## 2018-05-05 MED ORDER — ONDANSETRON HCL 4 MG/2ML IJ SOLN
4.00 | INTRAMUSCULAR | Status: DC
Start: ? — End: 2018-05-05

## 2018-05-13 ENCOUNTER — Emergency Department
Admission: EM | Admit: 2018-05-13 | Discharge: 2018-05-14 | Disposition: A | Discharge status: Home / Self Care | Payer: 59 | Attending: Emergency Medicine | Admitting: Emergency Medicine

## 2018-05-13 ENCOUNTER — Encounter: Payer: Self-pay | Admitting: Emergency Medicine

## 2018-05-13 ENCOUNTER — Emergency Department: Payer: 59

## 2018-05-13 DIAGNOSIS — Z7982 Long term (current) use of aspirin: Secondary | ICD-10-CM | POA: Diagnosis not present

## 2018-05-13 DIAGNOSIS — O9989 Other specified diseases and conditions complicating pregnancy, childbirth and the puerperium: Secondary | ICD-10-CM | POA: Diagnosis not present

## 2018-05-13 DIAGNOSIS — Z79899 Other long term (current) drug therapy: Secondary | ICD-10-CM | POA: Diagnosis not present

## 2018-05-13 DIAGNOSIS — M79604 Pain in right leg: Secondary | ICD-10-CM | POA: Insufficient documentation

## 2018-05-13 DIAGNOSIS — O9089 Other complications of the puerperium, not elsewhere classified: Secondary | ICD-10-CM | POA: Diagnosis not present

## 2018-05-13 DIAGNOSIS — I10 Essential (primary) hypertension: Secondary | ICD-10-CM | POA: Insufficient documentation

## 2018-05-13 DIAGNOSIS — E119 Type 2 diabetes mellitus without complications: Secondary | ICD-10-CM | POA: Insufficient documentation

## 2018-05-13 DIAGNOSIS — O1495 Unspecified pre-eclampsia, complicating the puerperium: Secondary | ICD-10-CM

## 2018-05-13 DIAGNOSIS — Z794 Long term (current) use of insulin: Secondary | ICD-10-CM | POA: Insufficient documentation

## 2018-05-13 DIAGNOSIS — R079 Chest pain, unspecified: Secondary | ICD-10-CM | POA: Diagnosis not present

## 2018-05-13 LAB — CBC
HEMATOCRIT: 32.2 % — AB (ref 36.0–46.0)
Hemoglobin: 10.2 g/dL — ABNORMAL LOW (ref 12.0–15.0)
MCH: 28.3 pg (ref 26.0–34.0)
MCHC: 31.7 g/dL (ref 30.0–36.0)
MCV: 89.2 fL (ref 80.0–100.0)
NRBC: 0 % (ref 0.0–0.2)
Platelets: 412 10*3/uL — ABNORMAL HIGH (ref 150–400)
RBC: 3.61 MIL/uL — AB (ref 3.87–5.11)
RDW: 15.9 % — ABNORMAL HIGH (ref 11.5–15.5)
WBC: 8.4 10*3/uL (ref 4.0–10.5)

## 2018-05-13 LAB — TROPONIN I: Troponin I: <0.03 ng/mL (ref <0.03)

## 2018-05-13 LAB — BASIC METABOLIC PANEL
ANION GAP: 10 (ref 5–15)
BUN: 19 mg/dL (ref 6–20)
CHLORIDE: 107 mmol/L (ref 98–111)
CO2: 22 mmol/L (ref 22–32)
Calcium: 8.9 mg/dL (ref 8.9–10.3)
Creatinine, Ser: 0.74 mg/dL (ref 0.44–1.00)
GFR calc non Af Amer: >60 mL/min (ref >60)
GLUCOSE: 125 mg/dL — AB (ref 70–99)
Potassium: 3.7 mmol/L (ref 3.5–5.1)
Sodium: 139 mmol/L (ref 135–145)

## 2018-05-13 MED ORDER — MAGNESIUM SULFATE BOLUS VIA INFUSION
4.0000 g | Freq: Once | INTRAVENOUS | Status: AC
  Administered 2018-05-14: 4 g via INTRAVENOUS
  Filled 2018-05-13: qty 500

## 2018-05-13 MED ORDER — LABETALOL HCL 5 MG/ML IV SOLN: 20.0000 mg | INTRAVENOUS | Status: DC | PRN

## 2018-05-13 MED ORDER — LABETALOL HCL 5 MG/ML IV SOLN: 40.0000 mg | INTRAVENOUS | Status: DC | PRN

## 2018-05-13 MED ORDER — HYDRALAZINE HCL 20 MG/ML IJ SOLN: 10.0000 mg | INTRAMUSCULAR | Status: DC | PRN

## 2018-05-13 MED ORDER — LABETALOL HCL 5 MG/ML IV SOLN
20.0000 mg | Freq: Once | INTRAVENOUS | Status: AC
  Administered 2018-05-13: 20 mg via INTRAVENOUS
  Filled 2018-05-13: qty 4

## 2018-05-13 MED ORDER — LABETALOL HCL 5 MG/ML IV SOLN: 80.0000 mg | INTRAVENOUS | Status: DC | PRN

## 2018-05-13 MED ORDER — MAGNESIUM SULFATE 40 G IN LACTATED RINGERS - SIMPLE
1.0000 g/h | INTRAVENOUS | Status: DC
  Administered 2018-05-13: 1 g/h via INTRAVENOUS
  Filled 2018-05-13: qty 40

## 2018-05-13 MED ORDER — MAGNESIUM SULFATE 2 GM/50ML IV SOLN
INTRAVENOUS | Status: AC
  Administered 2018-05-13: 4 g
  Filled 2018-05-13: qty 100

## 2018-05-13 NOTE — ED Provider Notes (Signed)
[facility] Emergency Department Provider Note   ____________________________________________   First MD Initiated Contact with Patient 05/13/18 2307     (approximate)  I have reviewed the triage vital signs and the nursing notes.   HISTORY  Chief Complaint Chest Pain and Hypertension    HPI Rachel Rice is a 37 y.o. female who presents to the ED from home with a chief complaint of elevated blood pressure, headache, flank pain and chest pain.  Patient is followed by [facility] OB/GYN for preeclampsia.  Delivered via C-section on 10/28 at [redacted]w[redacted]d  Has been maintained on labetalol since.  Has had 2 hospitalizations since delivery; once for postpartum preeclampsia and subsequently for chest pain, pyelonephritis.  Reports normal blood pressure in the 120s/80s this morning.  Tonight she felt "off" with headache, right flank pain and generalized malaise.  Took her blood pressure which was 182/108.  [facility] and recheck blood pressure 15 minutes afterwards.  Blood pressure remained elevated and she spoke again with on-call OB/GYN who referred her to the emergency department.  [facility] chest pain and shortness of breath so her husband diverted to our facility.  Patient denies recent fever, chills, cough, congestion, nausea, vomiting, dysuria, swelling in her legs.  She does mention she is having pain in her right calf.   Past Medical History:  Diagnosis Date  . Diabetes mellitus without complication ([facility]   . GERD (gastroesophageal reflux disease)   . Hypertension     Patient Active Problem List   Diagnosis Date Noted  . Advanced maternal age, 1st pregnancy, second trimester   . Type 2 diabetes mellitus without complication, without long-term current use of insulin ([facility] 08/31/2017  . Essential hypertension 08/31/2017    Past  Surgical History:  Procedure Laterality Date  . CESAREAN SECTION    . TYMPANOSTOMY TUBE PLACEMENT      Prior to Admission medications   Medication Sig Start Date End Date Taking? Authorizing Provider  ACCU-CHEK SOFTCLIX LANCETS lancets Use as instructed to test blood sugar 2 times daily 09/14/17   CPleas Koch NP  aspirin 81 MG chewable tablet Chew by mouth daily.    [provider]  Blood Glucose Monitoring Suppl (ACCU-CHEK AVIVA PLUS) w/Device KIT Use as instructed to test blood sugar daily 09/13/17   CPleas Koch NP  doxylamine, Sleep, (UNISOM) 25 MG tablet Take 25 mg by mouth at bedtime as needed.    [provider]  glucose blood (ACCU-CHEK AVIVA PLUS) test strip Use as instructed as instructed to test blood sugar 2 times daily 09/13/17   CPleas Koch NP  insulin detemir (LEVEMIR) 100 UNIT/ML injection Inject 0.1 mLs (10 Units total) into the skin at bedtime. Patient not taking: Reported on 01/26/2018 11/03/17 05/14/18  EWynona Neat MD  insulin regular (NOVOLIN R,HUMULIN R) 100 units/mL injection Inject into the skin 3 (three) times daily before meals.     [provider]  labetalol (NORMODYNE) 100 MG tablet Take 1 tablet by mouth every morning and half tablet by mouth every night at bedtime 01/03/18   CPleas Koch NP  Metformin HCl 500 MG/5ML SOLN Take 500 mg by mouth 2 (two) times daily.    [provider]  Prenatal MV & Min w/FA-DHA (PRENATAL ADULT GUMMY/DHA/FA PO) Take 2 tablets by mouth daily.  [provider]  Probiotic Product (PROBIOTIC-10) CAPS Take 1 capsule by mouth daily.    [provider]    Allergies Contrast media [iodinated diagnostic agents]; Fluticasone; and Latex  Family History  Problem Relation Age of Onset  . Ovarian cancer Paternal Grandmother 33  . Hypertension Mother   . Hypertension Father   . Diabetes Father        secondary to agent orange exposure  . Hypertension Brother    . Heart disease Paternal Grandfather   . Hypertension Brother   . Breast cancer Neg Hx     Social History Social History   Tobacco Use  . Smoking status: Never Smoker  . Smokeless tobacco: Never Used  Substance Use Topics  . Alcohol use: Not Currently    Comment: rare  . Drug use: No    Review of Systems  Constitutional: No fever/chills Eyes: No visual changes. ENT: No sore throat. Cardiovascular: Positive for chest pain. Respiratory: Positive for shortness of breath. Gastrointestinal: Positive for right flank pain.  No abdominal pain.  No nausea, no vomiting.  No diarrhea.  No constipation. Genitourinary: Negative for dysuria. Musculoskeletal: Negative for back pain. Skin: Negative for rash. Neurological: Positive for headaches.  Negative for focal weakness or numbness.   ____________________________________________   PHYSICAL EXAM:  VITAL SIGNS: ED Triage Vitals  Enc Vitals Group     BP 05/13/18 2150 (!) 184/95     Pulse Rate 05/13/18 2149 71     Resp 05/13/18 2149 18     Temp --      Temp src --      SpO2 05/13/18 2150 99 %     Weight 05/13/18 2150 170 lb (77.1 kg)     Height 05/13/18 2149 '5\' 5"'$  (1.651 m)     Head Circumference --      Peak Flow --      Pain Score 05/13/18 2150 7     Pain Loc --      Pain Edu? --      Excl. in [place]? --     Constitutional: Alert and oriented.  Tired appearing and in mild acute distress. Eyes: Conjunctivae are normal. PERRL. EOMI. Head: Atraumatic. Nose: No congestion/rhinnorhea. Mouth/Throat: Mucous membranes are moist.  Oropharynx non-erythematous. Neck: No stridor.   Cardiovascular: Normal rate, regular rhythm. Grossly normal heart sounds.  Good peripheral circulation. Respiratory: Normal respiratory effort.  No retractions. Lungs CTAB. Gastrointestinal: Soft and nontender to light or deep palpation. No distention. No abdominal bruits.  Mild right CVA tenderness. Musculoskeletal: No lower extremity tenderness nor  edema.  Mild tenderness to palpation to right calf.  No joint effusions. Neurologic:  Normal speech and language. No gross focal neurologic deficits are appreciated.  Skin:  Skin is warm, dry and intact. No rash noted. Psychiatric: Mood and affect are normal. Speech and behavior are normal.  ____________________________________________   LABS (all labs ordered are listed, but only abnormal results are displayed)  Labs Reviewed  BASIC METABOLIC PANEL - Abnormal; Notable for the following components:      Result Value   Glucose, Bld 125 (*)    All other components within normal limits  CBC - Abnormal; Notable for the following components:   RBC 3.61 (*)    Hemoglobin 10.2 (*)    HCT 32.2 (*)    RDW 15.9 (*)    Platelets 412 (*)    All other components within normal limits  URINALYSIS, COMPLETE (UACMP) WITH MICROSCOPIC - Abnormal; Notable for  the following components:   Color, Urine COLORLESS (*)    APPearance CLEAR (*)    All other components within normal limits  TROPONIN I  HEPATIC FUNCTION PANEL  LIPASE, BLOOD  LACTATE DEHYDROGENASE  URIC ACID  MAGNESIUM  TROPONIN I   ____________________________________________  EKG  ED ECG REPORT I, Julianah Marciel J, the attending physician, personally viewed and interpreted this ECG.   Date: 05/13/2018  EKG Time: 2147  Rate: 74  Rhythm: normal EKG, normal sinus rhythm  Axis: Normal  Intervals:none  ST&T Change: Nonspecific  ____________________________________________  RADIOLOGY  ED MD interpretation: No acute cardiopulmonary process  Official radiology report(s): Dg Chest 2 View  Result Date: 05/13/2018 CLINICAL DATA:  Chest pain EXAM: CHEST - 2 VIEW COMPARISON:  None. FINDINGS: Heart and mediastinal contours are within normal limits. No focal opacities or effusions. No acute bony abnormality. IMPRESSION: No active cardiopulmonary disease. Electronically Signed   By: Rolm Baptise M.D.   On: 05/13/2018 22:27   Ct Angio  Chest Pe W/cm &/or Wo Cm  Result Date: 05/14/2018 CLINICAL DATA:  Headache and right flank pain. Hypertension. Left-sided chest pain radiating to left arm. EXAM: CT ANGIOGRAPHY CHEST WITH CONTRAST TECHNIQUE: Multidetector CT imaging of the chest was performed using the standard protocol during bolus administration of intravenous contrast. Multiplanar CT image reconstructions and MIPs were obtained to evaluate the vascular anatomy. CONTRAST:  49m OMNIPAQUE IOHEXOL 350 MG/ML SOLN COMPARISON:  None. FINDINGS: Cardiovascular: --Pulmonary arteries: Contrast injection is sufficient to demonstrate satisfactory opacification of the pulmonary arteries to the segmental level. There is no pulmonary embolus. The main pulmonary artery is within normal limits for size. --Aorta: Limited opacification of the aorta due to bolus timing optimization for the pulmonary arteries. Conventional 3 vessel aortic branching pattern. The aortic course and caliber are normal. There is no aortic atherosclerosis. --Heart: Normal size. No pericardial effusion. Mediastinum/Nodes: No mediastinal, hilar or axillary lymphadenopathy. The visualized thyroid and thoracic esophageal course are unremarkable. Lungs/Pleura: No pulmonary nodules or masses. No pleural effusion or pneumothorax. No focal airspace consolidation. No focal pleural abnormality. Upper Abdomen: Contrast bolus timing is not optimized for evaluation of the abdominal organs. Within this limitation, the visualized organs of the upper abdomen are normal. Musculoskeletal: No chest wall abnormality. No acute or significant osseous findings. Review of the MIP images confirms the above findings. IMPRESSION: No pulmonary embolus or other acute thoracic abnormality. Electronically Signed   By: KUlyses JarredM.D.   On: 05/14/2018 04:47   UKoreaVenous Img Lower Unilateral Right  Result Date: 05/14/2018 CLINICAL DATA:  Acute onset of right calf pain and generalized chest pain. Recently  postpartum. EXAM: RIGHT LOWER EXTREMITY VENOUS DOPPLER ULTRASOUND TECHNIQUE: Gray-scale sonography with graded compression, as well as color Doppler and duplex ultrasound were performed to evaluate the lower extremity deep venous systems from the level of the common femoral vein and including the common femoral, femoral, profunda femoral, popliteal and calf veins including the posterior tibial, peroneal and gastrocnemius veins when visible. The superficial great saphenous vein was also interrogated. Spectral Doppler was utilized to evaluate flow at rest and with distal augmentation maneuvers in the common femoral, femoral and popliteal veins. COMPARISON:  None. FINDINGS: Contralateral Common Femoral Vein: Respiratory phasicity is normal and symmetric with the symptomatic side. No evidence of thrombus. Normal compressibility. Common Femoral Vein: No evidence of thrombus. Normal compressibility, respiratory phasicity and response to augmentation. Saphenofemoral Junction: No evidence of thrombus. Normal compressibility and flow on color Doppler imaging. Profunda Femoral Vein:  No evidence of thrombus. Normal compressibility and flow on color Doppler imaging. Femoral Vein: No evidence of thrombus. Normal compressibility, respiratory phasicity and response to augmentation. Popliteal Vein: No evidence of thrombus. Normal compressibility, respiratory phasicity and response to augmentation. Calf Veins: No evidence of thrombus. Normal compressibility and flow on color Doppler imaging. Superficial Great Saphenous Vein: No evidence of thrombus. Normal compressibility. Venous Reflux:  None. Other Findings:  None. IMPRESSION: No evidence of deep venous thrombosis. Electronically Signed   By: Garald Balding M.D.   On: 05/14/2018 02:48    ____________________________________________   PROCEDURES  Procedure(s) performed: None  Procedures  Critical Care performed: Yes, see critical care note(s)   CRITICAL  CARE Performed by: Paulette Blanch   Total critical care time: 45 minutes  Critical care time was exclusive of separately billable procedures and treating other patients.  Critical care was necessary to treat or prevent imminent or life-threatening deterioration.  Critical care was time spent personally by me on the following activities: development of treatment plan with patient and/or surrogate as well as nursing, discussions with consultants, evaluation of patient's response to treatment, examination of patient, obtaining history from patient or surrogate, ordering and performing treatments and interventions, ordering and review of laboratory studies, ordering and review of radiographic studies, pulse oximetry and re-evaluation of patient's condition.  ____________________________________________   INITIAL IMPRESSION / ASSESSMENT AND PLAN / ED COURSE  As part of my medical decision making, I reviewed the following data within the [place] History obtained from family, Nursing notes reviewed and incorporated, Labs reviewed, EKG interpreted, Old chart reviewed, Radiograph reviewed and Notes from prior ED visits   37 year old female followed by [facility] for postpartum preeclampsia who presents with elevated blood pressure, headache, right flank pain, chest pain, right calf pain and generalized malaise.  Differential diagnosis includes but is not limited to preeclampsia, UTI, metabolic, infectious etiologies, etc.  BP 184/95 here which is approximately 2 hours after taking an extra dose of her labetalol '600mg'$ .  Will dose 20 mg IV labetalol, initiate IV magnesium bolus with infusion.  In addition to labs already drawn, will check LFTs, lipase and urinalysis.  Patient has a contrast dye allergy so I am unable to scan her now for PE.  She will also need right lower leg Doppler ultrasound to evaluate for DVT.  Will initiate transfer to [place] for continued care for their  established patient.  Clinical Course as of May 15 711  Sun May 14, 2018  0013 BP 155/91 approximately 35 minutes after administration of 20 mg IV labetalol.  Will repeat dosage.  [facility] transfer center contacted for transfer.   [JS]  [place] Spoke with OB/GYN resident on call from [place].  Would not recommend additional labetalol for systolic blood pressure in the 150s.  Would not recommend additional magnesium given that patient has already received 24-hour magnesium previously.  Agree with proceeding with venous ultrasound to evaluate for right lower extremity DVT.  At this time there is no criteria for urgent transfer.  However, her attending will call me back to discuss.  In the interim, I will order prep medications for CTA chest to evaluate for PE.   [JS]  0117 Spoke with Dr. Lehman Prom who is the attending OB/GYN on-call.  Updated her of the rest of patient's lab results as well as her blood pressure of 137/82.  Will complete the rest of patient's work-up here including Doppler ultrasound and CT chest.  Will call back to  [facility] if needed.  Otherwise, anticipate discharge home with increase labetalol 800 mg tid.   [JS]  [id number] Updated patient on CT scan results.  Blood pressure 165/97.  As previously discussed with [facility], will give 40 mg IV labetalol and monitor for an additional period of time.  Anticipate discharge home with the increased labetalol 800 mg tid as discussed with OB/GYN.  Patient currently voices no complaints.   [JS]  3005 BP 137/92.  Patient overall feels better.  Will call her OB/GYN first thing Monday morning for follow-up.  Strict return precautions given.  Patient verbalizes understanding and agrees with plan of care.   [JS]    Clinical Course User Index [JS] Paulette Blanch, MD     ____________________________________________   FINAL CLINICAL IMPRESSION(S) / ED DIAGNOSES  Final diagnoses:  Nonspecific chest pain  Hypertension, unspecified type  Preeclampsia in  postpartum period     ED Discharge Orders    None       Note:  This document was prepared using Dragon voice recognition software and may include unintentional dictation errors.    Paulette Blanch, MD 05/14/18 [phone]

## 2018-05-13 NOTE — ED Notes (Signed)
Pt assisted to wheelchair upon arrival; c/o central chest pain, left arm numbness and says "my blood pressure is through the roof"

## 2018-05-13 NOTE — ED Triage Notes (Signed)
Rachel Rice states that she has a headache and right flank pain that started earlier today. Rachel Rice states that she checked her blood pressure and it was 182/108. Rachel [facility]. Rachel Rice states that she delivered by c section on 04/24/18. Rachel Rice states that she developed left side chest pain radiating to her left arm about 10 minutes ago on the way to the hospital.

## 2018-05-14 ENCOUNTER — Emergency Department: Payer: 59

## 2018-05-14 ENCOUNTER — Encounter: Payer: Self-pay | Admitting: Radiology

## 2018-05-14 LAB — TROPONIN I

## 2018-05-14 LAB — URIC ACID: URIC ACID, SERUM: 6.7 mg/dL (ref 2.5–7.1)

## 2018-05-14 LAB — URINALYSIS, COMPLETE (UACMP) WITH MICROSCOPIC
Bacteria, UA: NONE SEEN
Bilirubin Urine: NEGATIVE
GLUCOSE, UA: NEGATIVE mg/dL
HGB URINE DIPSTICK: NEGATIVE
Ketones, ur: NEGATIVE mg/dL
LEUKOCYTES UA: NEGATIVE
Nitrite: NEGATIVE
PROTEIN: NEGATIVE mg/dL
SPECIFIC GRAVITY, URINE: 1.005 (ref 1.005–1.030)
SQUAMOUS EPITHELIAL / LPF: NONE SEEN (ref 0–5)
pH: 5 (ref 5.0–8.0)

## 2018-05-14 LAB — MAGNESIUM: MAGNESIUM: 1.9 mg/dL (ref 1.7–2.4)

## 2018-05-14 LAB — LACTATE DEHYDROGENASE: LDH: 141 U/L (ref 98–192)

## 2018-05-14 LAB — HEPATIC FUNCTION PANEL
ALT: 27 U/L (ref 0–44)
AST: 23 U/L (ref 15–41)
Albumin: 3.7 g/dL (ref 3.5–5.0)
Alkaline Phosphatase: 94 U/L (ref 38–126)
TOTAL PROTEIN: 7.5 g/dL (ref 6.5–8.1)
Total Bilirubin: 0.4 mg/dL (ref 0.3–1.2)

## 2018-05-14 LAB — LIPASE, BLOOD: LIPASE: 50 U/L (ref 11–51)

## 2018-05-14 MED ORDER — LABETALOL HCL 5 MG/ML IV SOLN: 20.0000 mg | Freq: Once | INTRAVENOUS | Status: DC

## 2018-05-14 MED ORDER — METHYLPREDNISOLONE SODIUM SUCC 40 MG IJ SOLR
40.0000 mg | Freq: Once | INTRAMUSCULAR | Status: AC
  Administered 2018-05-14: 40 mg via INTRAVENOUS
  Filled 2018-05-14: qty 1

## 2018-05-14 MED ORDER — LABETALOL HCL 5 MG/ML IV SOLN
40.0000 mg | Freq: Once | INTRAVENOUS | Status: AC
  Administered 2018-05-14: 40 mg via INTRAVENOUS
  Filled 2018-05-14: qty 8

## 2018-05-14 MED ORDER — DIPHENHYDRAMINE HCL 50 MG/ML IJ SOLN
50.0000 mg | Freq: Once | INTRAMUSCULAR | Status: AC
  Administered 2018-05-14: 50 mg via INTRAVENOUS
  Filled 2018-05-14: qty 1

## 2018-05-14 MED ORDER — IOHEXOL 350 MG/ML SOLN
75.0000 mL | Freq: Once | INTRAVENOUS | Status: AC | PRN
  Administered 2018-05-14: 75 mL via INTRAVENOUS

## 2018-05-14 NOTE — ED Notes (Signed)
1 bottle of expressed breast milk retrieved from outpatient refrig in lab and returned to pt

## 2018-05-14 NOTE — ED Notes (Signed)
Pt dressing and calling for ride - explained to pt how to get to lobby and where phone was in lobby to call spouse for ride home

## 2018-05-14 NOTE — Discharge Instructions (Signed)
1.  Please increase your Labetalol to 800 mg 3 times daily. 2.  Return to the ER for recurrent or worsening symptoms, persistent vomiting, difficulty breathing or other concerns.

## 2018-05-16 ENCOUNTER — Ambulatory Visit: Payer: 59 | Admitting: Primary Care

## 2018-05-18 ENCOUNTER — Ambulatory Visit (INDEPENDENT_AMBULATORY_CARE_PROVIDER_SITE_OTHER): Payer: 59 | Admitting: Primary Care

## 2018-05-18 ENCOUNTER — Encounter: Payer: Self-pay | Admitting: Primary Care

## 2018-05-18 ENCOUNTER — Telehealth: Payer: Self-pay | Admitting: Primary Care

## 2018-05-18 VITALS — BP 110/72 | HR 81 | Temp 98.2°F | Ht 65.0 in | Wt 180.2 lb

## 2018-05-18 DIAGNOSIS — I10 Essential (primary) hypertension: Secondary | ICD-10-CM

## 2018-05-18 DIAGNOSIS — K769 Liver disease, unspecified: Secondary | ICD-10-CM | POA: Diagnosis not present

## 2018-05-18 MED ORDER — LISINOPRIL 10 MG PO TABS: 10.0000 mg | ORAL_TABLET | Freq: Every day | ORAL | 0 refills | Status: DC

## 2018-05-18 NOTE — Addendum Note (Signed)
Addended by: Doreene NestLARK, Jese Comella K on: 05/18/2018 03:14 PM   Modules accepted: Orders

## 2018-05-18 NOTE — Patient Instructions (Signed)
Please notify me if your OB/GYN is okay with me changing your blood pressure medication regimen.   [facility] front [facility]  It was a pleasure to see you today!

## 2018-05-18 NOTE — Telephone Encounter (Signed)
Spoke to pts OB, Dr Haynes Hoehnotters-Katz who states [facility] discussed pt restarting lisinopril. Pt was advised her to contact her OB for her opinion on doing so. OB advised she believes it would be ok to restart lisinopril but she is needing to slowly wean off of labeatol, appx 1 week after restarting lisinopril, to prevent a hypertensive emergency. Dr Haynes Hoehnotters-Katz can be reached tomorrow afternoon at the number provided, if needed

## 2018-05-18 NOTE — Telephone Encounter (Addendum)
Noted and appreciate Dr. Haynes Hoehnotters-Katz recommendations.  Please notify patient to start lisinopril 10 mg once daily for high blood pressure, I sent a new prescription to her pharmacy.  She will need to continue her labetalol as prescribed for 1 week then start to wean down slowly.    Continue 600 mg 3 times daily for the first week, while you start lisinopril 10 mg, this is 15 ml three times daily. Then reduce to 300 mg three times daily for the second week, this is 7.5 ml three times daily. She will then need to see me in the office for a blood pressure check for further instructions.  Please schedule.  Have her monitor her blood pressure and call me if she sees readings <100/60 or >140/90. We will also copy notes to my chart, I do not see where she has used this in the past, so please ensure she gets this message.

## 2018-05-18 NOTE — Progress Notes (Signed)
Subjective:    Patient ID: Rachel Rice, female    DOB: 09-Jul-1980, 37 y.o.   MRN: [id number]  HPI  Rachel Rice is a 37 year old female who presents today to discuss medications for hypertension.  She is post partum since 04/24/18 and has been struggling with pre-eclampsia with admission from 04/24/18-04/28/18. Admitted again on 04/30/18-05/02/18, admitted again on 05/03/18-05/05/18. Emergency department visit on 05/13/18 for chest pain, headaches, right flank pain, dizziness, and left upper extremity pain. BP during her visit was 184/95 which was 2 hours after doubling her medication.  She is managed on labetalol 600 mg three times daily presently, this has been altered on several occasions. She is not breast feeding much, her daughter is formula fed mostly. She's felt bad since she has been taking labetalol.  Symptoms include dizziness, feeling tired. She's not sure if it's from the medication or her pre-eclampsia. She is following with OB/GYN. She's been monitoring her BP at home which is running 120's/80's with a few 76'B diastolic.   She underwent CT scan of the abdomen pelvis on 04/30/18 which was grossly unremarkable except for an indeterminate small low-attenuation left liver lobe lesion. MRI was recommended in the outpatient setting.  She was not aware of these results.  BP Readings from Last 3 Encounters:  05/18/18 110/72  05/14/18 (!) 157/91  04/30/18 (!) 156/94     Review of Systems  Constitutional: Positive for fatigue.  Cardiovascular: Negative for chest pain.  Gastrointestinal: Negative for nausea and vomiting.  Genitourinary: Positive for flank pain.  Musculoskeletal: Positive for arthralgias.  Neurological: Positive for dizziness and headaches.       Past Medical History:  Diagnosis Date  . Diabetes mellitus without complication ([place])   . GERD (gastroesophageal reflux disease)   . Hypertension      Social History   Socioeconomic History  . Marital status:  Married    Spouse name: Not on file  . Number of children: Not on file  . Years of education: Not on file  . Highest education level: Not on file  Occupational History  . Not on file  Social Needs  . Financial resource strain: Not on file  . Food insecurity:    Worry: Not on file    Inability: Not on file  . Transportation needs:    Medical: Not on file    Non-medical: Not on file  Tobacco Use  . Smoking status: Never Smoker  . Smokeless tobacco: Never Used  Substance and Sexual Activity  . Alcohol use: Not Currently    Comment: rare  . Drug use: No  . Sexual activity: Yes  Lifestyle  . Physical activity:    Days per week: Not on file    Minutes per session: Not on file  . Stress: Not on file  Relationships  . Social connections:    Talks on phone: Not on file    Gets together: Not on file    Attends religious service: Not on file    Active member of club or organization: Not on file    Attends meetings of clubs or organizations: Not on file    Relationship status: Not on file  . Intimate partner violence:    Fear of current or ex partner: Not on file    Emotionally abused: Not on file    Physically abused: Not on file    Forced sexual activity: Not on file  Other Topics Concern  . Not on file  Social History  Narrative   Married.    No Children.   Claims adjuster.    Past Surgical History:  Procedure Laterality Date  . CESAREAN SECTION    . TYMPANOSTOMY TUBE PLACEMENT      Family History  Problem Relation Age of Onset  . Ovarian cancer Paternal Grandmother 63  . Hypertension Mother   . Hypertension Father   . Diabetes Father        secondary to agent orange exposure  . Hypertension Brother   . Heart disease Paternal Grandfather   . Hypertension Brother   . Breast cancer Neg Hx     Allergies  Allergen Reactions  . Contrast Media [Iodinated Diagnostic Agents] Hives  . Fluticasone Other (See Comments)    nosebleed nosebleed   . Latex Rash     Current Outpatient Medications on File Prior to Visit  Medication Sig Dispense Refill  . ACCU-CHEK SOFTCLIX LANCETS lancets Use as instructed to test blood sugar 2 times daily 100 each 5  . aspirin 81 MG chewable tablet Chew by mouth daily.    . Blood Glucose Monitoring Suppl (ACCU-CHEK AVIVA PLUS) w/Device KIT Use as instructed to test blood sugar daily 1 kit 0  . doxylamine, Sleep, (UNISOM) 25 MG tablet Take 25 mg by mouth at bedtime as needed.    Marland Kitchen glucose blood (ACCU-CHEK AVIVA PLUS) test strip Use as instructed as instructed to test blood sugar 2 times daily 100 each 12  . insulin detemir (LEVEMIR) 100 UNIT/ML injection Inject 0.1 mLs (10 Units total) into the skin at bedtime. (Patient not taking: Reported on 01/26/2018) 3 mL 6  . insulin regular (NOVOLIN R,HUMULIN R) 100 units/mL injection Inject into the skin 3 (three) times daily before meals.     Marland Kitchen labetalol (NORMODYNE) 100 MG tablet Take 1 tablet by mouth every morning and half tablet by mouth every night at bedtime 135 tablet 1  . Metformin HCl 500 MG/5ML SOLN Take 500 mg by mouth 2 (two) times daily.    . Prenatal MV & Min w/FA-DHA (PRENATAL ADULT GUMMY/DHA/FA PO) Take 2 tablets by mouth daily.     . Probiotic Product (PROBIOTIC-10) CAPS Take 1 capsule by mouth daily.     No current facility-administered medications on file prior to visit.     BP 110/72   Pulse 81   Temp 98.2 F (36.8 C) (Oral)   Ht '5\' 5"'$  (1.651 m)   Wt 180 lb 4 oz (81.8 kg)   LMP 08/07/2017   SpO2 97%   BMI 30.00 kg/m    Objective:   Physical Exam  Constitutional: She appears well-nourished.  Neck: Neck supple.  Cardiovascular: Normal rate and regular rhythm.  Respiratory: Effort normal and breath sounds normal.  Skin: Skin is warm and dry.  Psychiatric:  Tearful at times during visit           Assessment & Plan:

## 2018-05-18 NOTE — Assessment & Plan Note (Signed)
Well-controlled on labetalol 600 mg 3 times daily.  She would like to come off of this regimen as she feels that labetalol has caused a lot of her symptoms.  Agree to change regimen once she is spoken with her OB/GYN.  Discussed that she would have to stop breast-feeding for which she is fine with as she hardly breast-feeds at this time.  Consider reinitiation of lisinopril as she did well on this historically.  Will await response from OB/GYN.

## 2018-05-18 NOTE — Assessment & Plan Note (Signed)
Noted on CT scan from early November 2019.  MRI of the abdomen ordered for further evaluation.

## 2018-05-19 DIAGNOSIS — K649 Unspecified hemorrhoids: Secondary | ICD-10-CM

## 2018-05-19 MED ORDER — HYDROCORTISONE 2.5 % RE CREA: 1.0000 "application " | TOPICAL_CREAM | Freq: Two times a day (BID) | RECTAL | 0 refills | Status: DC

## 2018-05-19 NOTE — Telephone Encounter (Signed)
Spoke to pt who states she checked her BP this am and readings were 107/80 and 115/82. Advised pt of Dr Haynes Hoehnotters-Katz recommendations and also advised pt that Jae DireKate sent her a mychart message that she may want to read for clarity. Per Jae DireKate, pt is to monitor BP and if readings are <100/60 or <140/90 to contact office back. Pt states she will continue to monitor and make office aware if she experiences any of the readings provided.

## 2018-05-21 ENCOUNTER — Encounter: Payer: Self-pay | Admitting: Emergency Medicine

## 2018-05-21 ENCOUNTER — Emergency Department
Admission: EM | Admit: 2018-05-21 | Discharge: 2018-05-21 | Disposition: A | Discharge status: Home / Self Care | Payer: 59 | Attending: Emergency Medicine | Admitting: Emergency Medicine

## 2018-05-21 ENCOUNTER — Other Ambulatory Visit: Payer: Self-pay

## 2018-05-21 DIAGNOSIS — Z794 Long term (current) use of insulin: Secondary | ICD-10-CM | POA: Insufficient documentation

## 2018-05-21 DIAGNOSIS — Z7982 Long term (current) use of aspirin: Secondary | ICD-10-CM | POA: Diagnosis not present

## 2018-05-21 DIAGNOSIS — K645 Perianal venous thrombosis: Secondary | ICD-10-CM | POA: Diagnosis not present

## 2018-05-21 DIAGNOSIS — E119 Type 2 diabetes mellitus without complications: Secondary | ICD-10-CM | POA: Diagnosis not present

## 2018-05-21 DIAGNOSIS — K649 Unspecified hemorrhoids: Secondary | ICD-10-CM | POA: Diagnosis present

## 2018-05-21 DIAGNOSIS — I10 Essential (primary) hypertension: Secondary | ICD-10-CM | POA: Diagnosis not present

## 2018-05-21 HISTORY — DX: Unspecified pre-eclampsia, complicating the puerperium: O14.95

## 2018-05-21 MED ORDER — OXYCODONE-ACETAMINOPHEN 5-325 MG PO TABS
1.0000 | ORAL_TABLET | Freq: Once | ORAL | Status: AC
  Administered 2018-05-21: 1 via ORAL
  Filled 2018-05-21: qty 1

## 2018-05-21 MED ORDER — LIDOCAINE HCL (PF) 1 % IJ SOLN
5.0000 mL | Freq: Once | INTRAMUSCULAR | Status: AC
  Administered 2018-05-21: 5 mL
  Filled 2018-05-21: qty 5

## 2018-05-21 MED ORDER — OXYCODONE-ACETAMINOPHEN 5-325 MG PO TABS: 1.0000 | ORAL_TABLET | Freq: Four times a day (QID) | ORAL | 0 refills | Status: DC | PRN

## 2018-05-21 NOTE — ED Provider Notes (Signed)
[facility] Emergency Department Provider Note  ____________________________________________   First MD Initiated Contact with Patient 05/21/18 1327     (approximate)  I have reviewed the triage vital signs and the nursing notes.   HISTORY  Chief Complaint Hemorrhoids   HPI Rachel Rice is a 37 y.o. female presents to the ED with complaint of painful hemorrhoids.  Patient states that she is 4 weeks postpartum and was seen yesterday and urgent care and treated for a UTI.  She states that at the urgent care they looked and confirmed hemorrhoids but stated that they do not do that at the urgent care.  She has been using prescription hemorrhoid medicine per her physician.  She is continued to have pain since that time.  Patient reports that she is not breast-feeding.  She rates her pain as a 7/10.   Past Medical History:  Diagnosis Date  . Advanced maternal age, 1st pregnancy, second trimester   . Diabetes mellitus without complication ([place])   . GERD (gastroesophageal reflux disease)   . Hypertension   . Preeclampsia in postpartum period   . Preeclampsia in postpartum period     Patient Active Problem List   Diagnosis Date Noted  . Liver lesion, left lobe 05/18/2018  . Type 2 diabetes mellitus without complication, without long-term current use of insulin ([place]) 08/31/2017  . Essential hypertension 08/31/2017    Past Surgical History:  Procedure Laterality Date  . CESAREAN SECTION    . TYMPANOSTOMY TUBE PLACEMENT      Prior to Admission medications   Medication Sig Start Date End Date Taking? Authorizing Provider  ACCU-CHEK SOFTCLIX LANCETS lancets Use as instructed to test blood sugar 2 times daily 09/14/17   Pleas Koch, NP  aspirin 81 MG chewable tablet Chew by mouth daily.    [provider]  Blood Glucose Monitoring Suppl (ACCU-CHEK AVIVA PLUS) w/Device KIT Use as instructed to test blood sugar daily 09/13/17   Pleas Koch,  NP  doxylamine, Sleep, (UNISOM) 25 MG tablet Take 25 mg by mouth at bedtime as needed.    [provider]  glucose blood (ACCU-CHEK AVIVA PLUS) test strip Use as instructed as instructed to test blood sugar 2 times daily 09/13/17   Pleas Koch, NP  hydrocortisone (ANUSOL-HC) 2.5 % rectal cream Place 1 application rectally 2 (two) times daily. 05/19/18   Pleas Koch, NP  insulin detemir (LEVEMIR) 100 UNIT/ML injection Inject 0.1 mLs (10 Units total) into the skin at bedtime. Patient not taking: Reported on 01/26/2018 11/03/17 05/14/18  Wynona Neat, MD  insulin regular (NOVOLIN R,HUMULIN R) 100 units/mL injection Inject into the skin 3 (three) times daily before meals.     [provider]  labetalol (TRANDATE) 40 mg/mL SUSP Take 600 mg by mouth 3 (three) times daily.    [provider]  lisinopril (PRINIVIL,ZESTRIL) 10 MG tablet Take 1 tablet (10 mg total) by mouth daily. For blood pressure. 05/18/18   Pleas Koch, NP  Metformin HCl 500 MG/5ML SOLN Take 500 mg by mouth 2 (two) times daily.    [provider]  oxyCODONE-acetaminophen (PERCOCET) 5-325 MG tablet Take 1 tablet by mouth every 6 (six) hours as needed for severe pain. 05/21/18 05/21/19  Johnn Hai, PA-C  Prenatal MV & Min w/FA-DHA (PRENATAL ADULT GUMMY/DHA/FA PO) Take 2 tablets by mouth daily.     [provider]  Probiotic Product (PROBIOTIC-10) CAPS Take 1 capsule by mouth daily.  [provider]    Allergies Fluticasone; Iodinated diagnostic agents; Gadolinium derivatives; and Latex  Family History  Problem Relation Age of Onset  . Ovarian cancer Paternal Grandmother 12  . Hypertension Mother   . Hypertension Father   . Diabetes Father        secondary to agent orange exposure  . Hypertension Brother   . Heart disease Paternal Grandfather   . Hypertension Brother   . Breast cancer Neg Hx     Social History Social History   Tobacco Use  .  Smoking status: Never Smoker  . Smokeless tobacco: Never Used  Substance Use Topics  . Alcohol use: Not Currently    Comment: rare  . Drug use: No    Review of Systems Constitutional: No fever/chills Cardiovascular: Denies chest pain. Respiratory: Denies shortness of breath. Gastrointestinal: No abdominal pain.  No nausea, no vomiting.  No diarrhea.  No constipation.  Positive for external hemorrhoids. Genitourinary: Negative for dysuria. Musculoskeletal: Negative for back pain. Skin: Negative for rash. Neurological: Negative for headaches, focal weakness or numbness. ___________________________________________   PHYSICAL EXAM:  VITAL SIGNS: ED Triage Vitals  Enc Vitals Group     BP 05/21/18 1303 115/74     Pulse Rate 05/21/18 1303 86     Resp 05/21/18 1303 18     Temp 05/21/18 1303 98.3 F (36.8 C)     Temp Source 05/21/18 1303 Oral     SpO2 05/21/18 1303 97 %     Weight 05/21/18 1305 180 lb (81.6 kg)     Height 05/21/18 1305 '5\' 5"'$  (1.651 m)     Head Circumference --      Peak Flow --      Pain Score 05/21/18 1305 7     Pain Loc --      Pain Edu? --      Excl. in [place]? --    Constitutional: Alert and oriented. Well appearing and in no acute distress. Eyes: Conjunctivae are normal.  Head: Atraumatic. Neck: No stridor.   Cardiovascular: Normal rate, regular rhythm. Grossly normal heart sounds.  Good peripheral circulation. Respiratory: Normal respiratory effort.  No retractions. Lungs CTAB. Gastrointestinal: There is a large thrombosed external hemorrhoid present that is extremely tender to palpation.  No active bleeding noted. Musculoskeletal: No lower extremity tenderness nor edema.  No joint effusions. Neurologic:  Normal speech and language. No gross focal neurologic deficits are appreciated.  Skin:  Skin is warm, dry and intact. No rash noted. Psychiatric: Mood and affect are normal. Speech and behavior are normal.  ____________________________________________    LABS (all labs ordered are listed, but only abnormal results are displayed)  Labs Reviewed - No data to display  PROCEDURES  Procedure(s) performed:   Marland KitchenMarland KitchenIncision and Drainage Date/Time: 05/21/2018 3:23 PM Performed by: Johnn Hai, PA-C Authorized by: Johnn Hai, PA-C   Consent:    Consent obtained:  Verbal   Consent given by:  Patient   Risks discussed:  Pain Location:    Type:  External thrombosed hemorrhoid   Location:  Anogenital   Anogenital location:  Perianal Pre-procedure details:    Skin preparation:  Antiseptic wash Anesthesia (see MAR for exact dosages):    Anesthesia method:  Local infiltration   Local anesthetic:  Lidocaine 1% w/o epi Procedure type:    Complexity:  Simple Procedure details:    Needle aspiration: no     Incision types:  Stab incision   Scalpel blade:  11   Wound  management:  Probed and deloculated   Drainage:  Bloody   Wound treatment:  Wound left open   Packing materials:  None Post-procedure details:    Patient tolerance of procedure:  Tolerated well, no immediate complications Comments:     2- 3 large clots were removed from the external thrombosed hemorrhoid.  Patient is aware that there are more areas that are involved and appear to be internally involved as well.  Size of the external hemorrhoid was reduced had decreased pain at the time of discharge.  2 separate incisions were made to achieve maximum removal.    Critical Care performed: No  ____________________________________________   INITIAL IMPRESSION / ASSESSMENT AND PLAN / ED COURSE  As part of my medical decision making, I reviewed the following data within the electronic MEDICAL RECORD NUMBER Notes from prior ED visits and [place] Controlled Substance Database  Patient presents to the ED 4 weeks postpartum with complaint of external hemorrhoids.  She was seen at an urgent care and told that this cannot be done and sent to the ED.  Patient was given Percocet 5/325  prior to procedure.  She is aware that external thrombosed hemorrhoids will need to be opened to give her some relief but that she should follow-up with a surgeon on a later date.  Patient denies breast-feeding.  Area was cleaned, lidocaine locally and incision was successful removing multiple clots.  Patient got some pain relief.  She was given a prescription for Percocet and will continue Colace to prevent constipation.  She has an appointment this week that was scheduled with her PCP.  She was given the name of the surgeon that some call and encouraged to follow-up.  She will begin using sitz bath's tonight.  She is to return to the emergency department if any worsening of her symptoms or urgent concerns.  ____________________________________________   FINAL CLINICAL IMPRESSION(S) / ED DIAGNOSES  Final diagnoses:  Thrombosed external hemorrhoid     ED Discharge Orders         Ordered    oxyCODONE-acetaminophen (PERCOCET) 5-325 MG tablet  Every 6 hours PRN     05/21/18 1510           Note:  This document was prepared using Dragon voice recognition software and may include unintentional dictation errors.    Johnn Hai, PA-C 05/21/18 1531    Schuyler Amor, MD 05/22/18 1017

## 2018-05-21 NOTE — Discharge Instructions (Addendum)
Follow-up with your primary care provider as already scheduled.  Make an appointment with the surgeon listed on your discharge papers or surgeon of your choice for future surgery on your hemorrhoids.  Today you may begin using sitz bath's frequently.  Take Percocet every 6 hours as needed for severe pain.  Do not breast-feed while taking this medication.  This medication could also cause drowsiness and increase your risk for falling.  Also continue taking your Colace to prevent constipation.

## 2018-05-21 NOTE — ED Triage Notes (Addendum)
Pt arrived via POV with reports of painful hemorrhoid. Pt states it not currently bleeding.  Pt  States she does have pain when sitting.  Pt is 4 weeks post partum.

## 2018-05-21 NOTE — ED Notes (Signed)
See triage note   Presents with some rectal discomfort  States she has a hx of hemorrhoids    PP 4 weeks

## 2018-05-22 ENCOUNTER — Telehealth: Payer: Self-pay

## 2018-05-22 ENCOUNTER — Telehealth: Payer: Self-pay | Admitting: General Surgery

## 2018-05-22 NOTE — Telephone Encounter (Signed)
Noted. Chart reviewed from recent ED visit, will see patient on 05/24/18 for follow up.

## 2018-05-22 NOTE — Telephone Encounter (Signed)
[facility] faxed note on 05/21/18 pt having hemorrhoid pain; [facility] pts chart [facility]. FYI to Allayne GitelmanK Clark NP. TH note in K Clark NP in box and copy sent for scanning.

## 2018-05-22 NOTE — Telephone Encounter (Signed)
I have called patient to make an appointment- per Dr Cannon-ED referral. No answer. The voicemail is full and I was not able to leave a message. I will follow up and send a letter after 3 attempts.    Thrombosed external hemorrhoid-Had an I&D in the ED.

## 2018-05-23 NOTE — Telephone Encounter (Signed)
Patient has called back and appointment has been made.  °

## 2018-05-24 ENCOUNTER — Encounter: Payer: Self-pay | Admitting: Primary Care

## 2018-05-24 ENCOUNTER — Encounter: Payer: Self-pay | Admitting: *Deleted

## 2018-05-24 ENCOUNTER — Ambulatory Visit (INDEPENDENT_AMBULATORY_CARE_PROVIDER_SITE_OTHER): Payer: 59 | Admitting: Primary Care

## 2018-05-24 DIAGNOSIS — I10 Essential (primary) hypertension: Secondary | ICD-10-CM

## 2018-05-24 DIAGNOSIS — K649 Unspecified hemorrhoids: Secondary | ICD-10-CM | POA: Diagnosis not present

## 2018-05-24 NOTE — Assessment & Plan Note (Signed)
Improved and stable on lisinopril 10 mg, continue same. Continue off labetalol.

## 2018-05-24 NOTE — Patient Instructions (Signed)
Continue taking lisinopril 10 mg daily.  Resume your Lovenox tomorrow as long as you don't see increased bleeding from the hemorrhoids.  Use the rectal cream if needed. Continue Sitz baths.  Follow up with the surgeon as scheduled.  Please update me once you've [facility], we can take over if needed.  It was a pleasure to see you today!

## 2018-05-24 NOTE — Assessment & Plan Note (Addendum)
Underwent I&D of hemorrhoid on 05/21/18. Exam today with non thrombosed hemorrhoids, no bleeding.  Discussed to start rectal cream, continue with Sitz baths. Resume Lovenox tomorrow if no rectal bleeding. Follow up with general surgery as scheduled.   ED notes reviewed.

## 2018-05-24 NOTE — Progress Notes (Signed)
Subjective:    Patient ID: Rachel Rice, female    DOB: 1981/03/14, 37 y.o.   MRN: [id number]  HPI  Rachel Rice is a 37 year old female who presents today for emergency department follow up.  She sent a message through the My Chart portal reporting painful hemorrhoid. She is post partum since October 2019. A prescription for Anusol-HC cream was sent to her pharmacy as this had been helpful in the past.   She presented to [facility] ED on 05/21/18 with a chief complaint of painful hemorrhoid. She initially presented to Urgent Care who sent her to the ED as they weren't equipped to treat. During her stay at Urgent Care she was diagnosed with a UTI after symptoms of urinary frequency and burning. She was treated with She endorsed using the Rx hemorrhoid medication without much improvement.   During her stay in the ED she underwent I&D of perianal external thrombosed hemorrhoid with 2-3 large clots removed. She felt immediate improvement. She was discharged home with a prescription for Macrobid. Percocet and recommendations for general surgery and PCP follow up. She was also encouraged to take Colace.   Since her ED visit she continues to experience rectal pain, mild bleeding when wiping at times. She's taking Advil and has noticed some improvement overall in pain.  She could not take the Percocet as it caused nausea with some vomiting. She has an appointment with general surgery on Monday next week. She is using the Sitz bath regularly with improvement. She had been on Lovenox for clot prevention post partum, has not used this since Sunday this week.   BP Readings from Last 3 Encounters:  05/24/18 122/78  05/21/18 109/76  05/18/18 110/72   She is off of her labetalol completely as she was experiencing hypotension with readings below 100/60. She is only taking lisinopril 10 mg daily and endorses normal BP readings at home. She has noticed her symptoms of dizziness, headaches, shortness of breath, etc  have improved.   Review of Systems  Respiratory: Negative for shortness of breath.   Cardiovascular: Negative for chest pain.  Gastrointestinal: Negative for abdominal pain, blood in stool, nausea and vomiting.       Hemorrhoids   Neurological: Negative for dizziness and headaches.       Past Medical History:  Diagnosis Date  . Advanced maternal age, 1st pregnancy, second trimester   . Diabetes mellitus without complication ([place])   . GERD (gastroesophageal reflux disease)   . Hypertension   . Preeclampsia in postpartum period   . Preeclampsia in postpartum period      Social History   Socioeconomic History  . Marital status: Married    Spouse name: Not on file  . Number of children: Not on file  . Years of education: Not on file  . Highest education level: Not on file  Occupational History  . Not on file  Social Needs  . Financial resource strain: Not on file  . Food insecurity:    Worry: Not on file    Inability: Not on file  . Transportation needs:    Medical: Not on file    Non-medical: Not on file  Tobacco Use  . Smoking status: Never Smoker  . Smokeless tobacco: Never Used  Substance and Sexual Activity  . Alcohol use: Not Currently    Comment: rare  . Drug use: No  . Sexual activity: Yes  Lifestyle  . Physical activity:    Days per week: Not on  file    Minutes per session: Not on file  . Stress: Not on file  Relationships  . Social connections:    Talks on phone: Not on file    Gets together: Not on file    Attends religious service: Not on file    Active member of club or organization: Not on file    Attends meetings of clubs or organizations: Not on file    Relationship status: Not on file  . Intimate partner violence:    Fear of current or ex partner: Not on file    Emotionally abused: Not on file    Physically abused: Not on file    Forced sexual activity: Not on file  Other Topics Concern  . Not on file  Social History Narrative    Married.    No Children.   Claims adjuster.    Past Surgical History:  Procedure Laterality Date  . CESAREAN SECTION    . TYMPANOSTOMY TUBE PLACEMENT      Family History  Problem Relation Age of Onset  . Ovarian cancer Paternal Grandmother 34  . Hypertension Mother   . Hypertension Father   . Diabetes Father        secondary to agent orange exposure  . Hypertension Brother   . Heart disease Paternal Grandfather   . Hypertension Brother   . Breast cancer Neg Hx     Allergies  Allergen Reactions  . Fluticasone Other (See Comments)    nosebleed nosebleed  Other reaction(s): Other (See Comments) nosebleed  . Iodinated Diagnostic Agents Hives  . Gadolinium Derivatives     Other reaction(s): Other (See Comments)  . Latex Rash    Current Outpatient Medications on File Prior to Visit  Medication Sig Dispense Refill  . ACCU-CHEK SOFTCLIX LANCETS lancets Use as instructed to test blood sugar 2 times daily 100 each 5  . Blood Glucose Monitoring Suppl (ACCU-CHEK AVIVA PLUS) w/Device KIT Use as instructed to test blood sugar daily 1 kit 0  . glucose blood (ACCU-CHEK AVIVA PLUS) test strip Use as instructed as instructed to test blood sugar 2 times daily 100 each 12  . hydrocortisone (ANUSOL-HC) 2.5 % rectal cream Place 1 application rectally 2 (two) times daily. 30 g 0  . lisinopril (PRINIVIL,ZESTRIL) 10 MG tablet Take 1 tablet (10 mg total) by mouth daily. For blood pressure. 30 tablet 0  . Metformin HCl 500 MG/5ML SOLN Take 500 mg by mouth 2 (two) times daily.    Marland Kitchen oxyCODONE-acetaminophen (PERCOCET) 5-325 MG tablet Take 1 tablet by mouth every 6 (six) hours as needed for severe pain. 12 tablet 0  . Prenatal MV & Min w/FA-DHA (PRENATAL ADULT GUMMY/DHA/FA PO) Take 2 tablets by mouth daily.     . Probiotic Product (PROBIOTIC-10) CAPS Take 1 capsule by mouth daily.     No current facility-administered medications on file prior to visit.     BP 122/78   Pulse 91   Temp 97.8 F  (36.6 C) (Oral)   Ht '5\' 5"'$  (1.651 m)   Wt 182 lb (82.6 kg)   LMP 08/07/2017   SpO2 98%   BMI 30.29 kg/m    Objective:   Physical Exam  Constitutional: She appears well-nourished.  Neck: Neck supple.  Cardiovascular: Normal rate and regular rhythm.  Respiratory: Effort normal and breath sounds normal.  GI:  2-3 large non thrombosed hemorrhoids noted to perianal region. No bleeding.   Skin: Skin is warm and dry.  Psychiatric: She has  a normal mood and affect.           Assessment & Plan:

## 2018-05-29 ENCOUNTER — Encounter: Payer: Self-pay | Admitting: General Surgery

## 2018-05-29 ENCOUNTER — Ambulatory Visit (INDEPENDENT_AMBULATORY_CARE_PROVIDER_SITE_OTHER): Payer: 59 | Admitting: General Surgery

## 2018-05-29 ENCOUNTER — Other Ambulatory Visit: Payer: Self-pay

## 2018-05-29 VITALS — BP 128/90 | HR 102 | Temp 97.9°F | Resp 20 | Ht 65.0 in | Wt 182.6 lb

## 2018-05-29 DIAGNOSIS — K649 Unspecified hemorrhoids: Secondary | ICD-10-CM

## 2018-05-29 NOTE — Patient Instructions (Addendum)
Patient is to return to the office as needed.  Call the office with any questions or concerns.    Hemorrhoids Hemorrhoids are swollen veins in and around the rectum or anus. There are two types of hemorrhoids:  Internal hemorrhoids. These occur in the veins that are just inside the rectum. They may poke through to the outside and become irritated and painful.  External hemorrhoids. These occur in the veins that are outside of the anus and can be felt as a painful swelling or hard lump near the anus.  Most hemorrhoids do not cause serious problems, and they can be managed with home treatments such as diet and lifestyle changes. If home treatments do not help your symptoms, procedures can be done to shrink or remove the hemorrhoids. What are the causes? This condition is caused by increased pressure in the anal area. This pressure may result from various things, including:  Constipation.  Straining to have a bowel movement.  Diarrhea.  Pregnancy.  Obesity.  Sitting for long periods of time.  Heavy lifting or other activity that causes you to strain.  Anal sex.  What are the signs or symptoms? Symptoms of this condition include:  Pain.  Anal itching or irritation.  Rectal bleeding.  Leakage of stool (feces).  Anal swelling.  One or more lumps around the anus.  How is this diagnosed? This condition can often be diagnosed through a visual exam. Other exams or tests may also be done, such as:  Examination of the rectal area with a gloved hand (digital rectal exam).  Examination of the anal canal using a small tube (anoscope).  A blood test, if you have lost a significant amount of blood.  A test to look inside the colon (sigmoidoscopy or colonoscopy).  How is this treated? This condition can usually be treated at home. However, various procedures may be done if dietary changes, lifestyle changes, and other home treatments do not help your symptoms. These  procedures can help make the hemorrhoids smaller or remove them completely. Some of these procedures involve surgery, and others do not. Common procedures include:  Rubber band ligation. Rubber bands are placed at the base of the hemorrhoids to cut off the blood supply to them.  Sclerotherapy. Medicine is injected into the hemorrhoids to shrink them.  Infrared coagulation. A type of light energy is used to get rid of the hemorrhoids.  Hemorrhoidectomy surgery. The hemorrhoids are surgically removed, and the veins that supply them are tied off.  Stapled hemorrhoidopexy surgery. A circular stapling device is used to remove the hemorrhoids and use staples to cut off the blood supply to them.  Follow these instructions at home: Eating and drinking  Eat foods that have a lot of fiber in them, such as whole grains, beans, nuts, fruits, and vegetables. Ask your health care provider about taking products that have added fiber (fiber supplements).  Drink enough fluid to keep your urine clear or pale yellow. Managing pain and swelling  Take warm sitz baths for 20 minutes, 3-4 times a day to ease pain and discomfort.  If directed, apply ice to the affected area. Using ice packs between sitz baths may be helpful. ? Put ice in a plastic bag. ? Place a towel between your skin and the bag. ? Leave the ice on for 20 minutes, 2-3 times a day. General instructions  Take over-the-counter and prescription medicines only as told by your health care provider.  Use medicated creams or suppositories as told.  Exercise regularly.  Go to the bathroom when you have the urge to have a bowel movement. Do not wait.  Avoid straining to have bowel movements.  Keep the anal area dry and clean. Use wet toilet paper or moist towelettes after a bowel movement.  Do not sit on the toilet for long periods of time. This increases blood pooling and pain. Contact a health care provider if:  You have increasing pain  and swelling that are not controlled by treatment or medicine.  You have uncontrolled bleeding.  You have difficulty having a bowel movement, or you are unable to have a bowel movement.  You have pain or inflammation outside the area of the hemorrhoids. This information is not intended to replace advice given to you by your health care provider. Make sure you discuss any questions you have with your health care provider. Document Released: 06/11/2000 Document Revised: 11/12/2015 Document Reviewed: 02/26/2015 Elsevier Interactive Patient Education  2018 [employer].      tions or concerns.

## 2018-05-29 NOTE — Progress Notes (Signed)
Patient ID: Rachel Rice, female   DOB: September 14, 1980, 37 y.o.   MRN: [id number]  Chief Complaint  Patient presents with  . Follow-up     new to office f/u seen in ER hemorrhoids    HPI Rachel Rice is a 37 y.o. female.   She is here for follow-up from an ED visit for thrombosed external hemorrhoids.  She had an I&D in the emergency department with expulsion of clot on 05/21/18.  She is about 4 weeks post-partum and states that she did not experience much trouble with hemorrhoids during her pregnancy.  She had been seen at urgent care for a UTI and at that time was diagnosed with thrombosed hemorrhoids and sent to the ED for evaluation.  She had been on Anusol with some relief prior to that, but the clot extirpation gave her much better relief. She still has some itching and burning pain, but this has improved, particularly with the addition of topical dibucaine.  She did have some bleeding immediately after the I&D, but this has resolved.  She has been taking colace to keep from having to strain to have a bowel movement, but yesterday did have to strain a little and noticed a small amount of blood on the tissue.  She has been performing Sitz baths, as advised.   Past Medical History:  Diagnosis Date  . Advanced maternal age, 1st pregnancy, second trimester   . Diabetes mellitus without complication ([place])   . GERD (gastroesophageal reflux disease)   . Hypertension   . Preeclampsia in postpartum period   . Preeclampsia in postpartum period     Past Surgical History:  Procedure Laterality Date  . CESAREAN SECTION    . TYMPANOSTOMY TUBE PLACEMENT      Family History  Problem Relation Age of Onset  . Ovarian cancer Paternal Grandmother 56  . Hypertension Mother   . Hypertension Father   . Diabetes Father        secondary to agent orange exposure  . Hypertension Brother   . Heart disease Paternal Grandfather   . Hypertension Brother   . Breast cancer Neg Hx     Social History Social  History   Tobacco Use  . Smoking status: Never Smoker  . Smokeless tobacco: Never Used  Substance Use Topics  . Alcohol use: Not Currently    Comment: rare  . Drug use: No    Allergies  Allergen Reactions  . Fluticasone Other (See Comments)    nosebleed nosebleed  Other reaction(s): Other (See Comments) nosebleed  . Iodinated Diagnostic Agents Hives  . Gadolinium Derivatives     Other reaction(s): Other (See Comments)  . Latex Rash    Current Outpatient Medications  Medication Sig Dispense Refill  . ACCU-CHEK SOFTCLIX LANCETS lancets Use as instructed to test blood sugar 2 times daily 100 each 5  . Blood Glucose Monitoring Suppl (ACCU-CHEK AVIVA PLUS) w/Device KIT Use as instructed to test blood sugar daily 1 kit 0  . enoxaparin (LOVENOX) 40 MG/0.4ML injection Inject 40 mg into the skin daily.    Marland Kitchen glucose blood (ACCU-CHEK AVIVA PLUS) test strip Use as instructed as instructed to test blood sugar 2 times daily 100 each 12  . hydrocortisone (ANUSOL-HC) 2.5 % rectal cream Place 1 application rectally 2 (two) times daily. 30 g 0  . ibuprofen (ADVIL,MOTRIN) 100 MG/5ML suspension TAKE 30 MLS (600 MG TOTAL) BY MOUTH EVERY 6 (SIX) HOURS AS NEEDED  0  . lisinopril (PRINIVIL,ZESTRIL) 10 MG tablet Take  1 tablet (10 mg total) by mouth daily. For blood pressure. 30 tablet 0  . Metformin HCl 500 MG/5ML SOLN Take 500 mg by mouth 2 (two) times daily.    . nitrofurantoin, macrocrystal-monohydrate, (MACROBID) 100 MG capsule Take 100 mg by mouth 2 (two) times daily.  0  . norethindrone-ethinyl estradiol (JUNEL FE,GILDESS FE,LOESTRIN FE) 1-20 MG-MCG tablet Take 1 tablet by mouth daily.    . Prenatal MV & Min w/FA-DHA (PRENATAL ADULT GUMMY/DHA/FA PO) Take 2 tablets by mouth daily.     . Probiotic Product (PROBIOTIC-10) CAPS Take 1 capsule by mouth daily.     No current facility-administered medications for this visit.     Review of Systems Review of Systems  All other systems reviewed and  are negative. or per the HPI  Blood pressure 128/90, pulse (!) 102, temperature 97.9 F (36.6 C), temperature source Temporal, resp. rate 20, height _0  (1.651 m), weight 182 lb 9.6 oz (82.8 kg), last menstrual period 08/07/2017, SpO2 98 %, currently breastfeeding.  Physical Exam Physical Exam  Constitutional: She is oriented to person, place, and time. She appears well-developed and well-nourished.  HENT:  Head: Normocephalic and atraumatic.  Mouth/Throat: No oropharyngeal exudate.  Eyes: Pupils are equal, round, and reactive to light. Right eye exhibits no discharge. Left eye exhibits no discharge. No scleral icterus.  Neck: Neck supple. No tracheal deviation present. No thyromegaly present.  Cardiovascular: Normal rate and regular rhythm.  Pulmonary/Chest: Effort normal and breath sounds normal.  Abdominal: Soft. Bowel sounds are normal.  Genitourinary:     Genitourinary Comments: External hemorrhoids present in all three pilars.  Skin tag present at site of prior clot evacuation. No residual thrombosis seen.  No fluctuance or drainage present.  Musculoskeletal: She exhibits no edema, tenderness or deformity.  Lymphadenopathy:    She has no cervical adenopathy.  Neurological: She is alert and oriented to person, place, and time.  Skin: Skin is warm and dry.  Psychiatric: She has a normal mood and affect.    Data Reviewed EMR reviewed for ED and PCP notes.  No relevant labs or imaging studies.  Assessment    37 y/o F with hemorrhoids, likely related to recent pregnancy, now s/p incision and evacuation of thrombosis on 05/21/18. Doing well with conservative management.    Plan    We discussed continued conservative management, using PCP-prescribed local medications, avoiding constipation, and continued use os Sitz baths.  We talked about hemorrhoid surgery and that I would not recommend it at this time, as it is extremely painful and she is responding well to current  treatment. No specific surgical follow up at this point, but she is welcome to return at any time.       Fredirick Maudlin 05/29/2018, 3:57 PM

## 2018-05-30 ENCOUNTER — Other Ambulatory Visit: Payer: 59

## 2018-05-31 ENCOUNTER — Ambulatory Visit (INDEPENDENT_AMBULATORY_CARE_PROVIDER_SITE_OTHER): Payer: 59 | Admitting: Family Medicine

## 2018-05-31 ENCOUNTER — Encounter: Payer: Self-pay | Admitting: Family Medicine

## 2018-05-31 VITALS — BP 122/72 | HR 103 | Temp 98.2°F | Ht 65.0 in | Wt 179.5 lb

## 2018-05-31 DIAGNOSIS — N390 Urinary tract infection, site not specified: Secondary | ICD-10-CM | POA: Insufficient documentation

## 2018-05-31 DIAGNOSIS — R300 Dysuria: Secondary | ICD-10-CM

## 2018-05-31 LAB — POC URINALSYSI DIPSTICK (AUTOMATED)
Bilirubin, UA: NEGATIVE
Blood, UA: 50
Glucose, UA: NEGATIVE
Ketones, UA: NEGATIVE
Nitrite, UA: NEGATIVE
Protein, UA: POSITIVE — AB
Spec Grav, UA: 1.025 (ref 1.010–1.025)
Urobilinogen, UA: 0.2 E.U./dL
pH, UA: 6 (ref 5.0–8.0)

## 2018-05-31 MED ORDER — SULFAMETHOXAZOLE-TRIMETHOPRIM 800-160 MG PO TABS: 1.0000 | ORAL_TABLET | Freq: Two times a day (BID) | ORAL | 0 refills | Status: DC

## 2018-05-31 NOTE — Progress Notes (Signed)
Subjective:    Patient ID: Rachel Rice, female    DOB: [birth year], 37 y.o.   MRN: [id number]  HPI 37 yo pt of NP Clark here with urinary symptoms  Had a baby a month ago H/o HTN- no longer breast feeding due to medication for this   Had a bladder infection in the hospital and was treated  1 week later- UC with another uti (tx with macrobid)  Did a culture - ? If neg   These symptoms started 3 d ago  Dysuria  No bladder pain  (has some CS surgical pain on and off)  Frequency to urination  Odor to urine   No nausea or fever BP Readings from Last 3 Encounters:  05/31/18 122/72  05/29/18 128/90  05/24/18 122/78   Pulse Readings from Last 3 Encounters:  05/31/18 (!) 103  05/29/18 (!) 102  05/24/18 91     Some bloody vaginal d/c  occ vaginal pain post delivery   (she did alert [facility])   UA today  Results for orders placed or performed in visit on 05/31/18  POCT Urinalysis Dipstick (Automated)  Result Value Ref Range   Color, UA Yellow    Clarity, UA Hazy    Glucose, UA Negative Negative   Bilirubin, UA Negative    Ketones, UA Negative    Spec Grav, UA 1.025 1.010 - 1.025   Blood, UA 50 Ery/uL    pH, UA 6.0 5.0 - 8.0   Protein, UA Positive (A) Negative   Urobilinogen, UA 0.2 0.2 or 1.0 E.U./dL   Nitrite, UA Negative    Leukocytes, UA Small (1+) (A) Negative    Also takes cystex - cranberry supplement    Patient Active Problem List   Diagnosis Date Noted  . Dysuria 05/31/2018  . Hemorrhoids 05/24/2018  . Liver lesion, left lobe 05/18/2018  . Type 2 diabetes mellitus without complication, without long-term current use of insulin ([place]) 08/31/2017  . Essential hypertension 08/31/2017   Past Medical History:  Diagnosis Date  . Advanced maternal age, 1st pregnancy, second trimester   . Diabetes mellitus without complication ([place])   . GERD (gastroesophageal reflux disease)   . Hypertension   . Preeclampsia in postpartum period   . Preeclampsia in  postpartum period    Past Surgical History:  Procedure Laterality Date  . CESAREAN SECTION    . TYMPANOSTOMY TUBE PLACEMENT     Social History   Tobacco Use  . Smoking status: Never Smoker  . Smokeless tobacco: Never Used  Substance Use Topics  . Alcohol use: Not Currently    Comment: rare  . Drug use: No   Family History  Problem Relation Age of Onset  . Ovarian cancer Paternal Grandmother 32  . Hypertension Mother   . Hypertension Father   . Diabetes Father        secondary to agent orange exposure  . Hypertension Brother   . Heart disease Paternal Grandfather   . Hypertension Brother   . Breast cancer Neg Hx    Allergies  Allergen Reactions  . Fluticasone Other (See Comments)    nosebleed nosebleed  Other reaction(s): Other (See Comments) nosebleed  . Iodinated Diagnostic Agents Hives  . Gadolinium Derivatives     Other reaction(s): Other (See Comments)  . Latex Rash   Current Outpatient Medications on File Prior to Visit  Medication Sig Dispense Refill  . ACCU-CHEK SOFTCLIX LANCETS lancets Use as instructed to test blood sugar 2 times daily 100  each 5  . Blood Glucose Monitoring Suppl (ACCU-CHEK AVIVA PLUS) w/Device KIT Use as instructed to test blood sugar daily 1 kit 0  . glucose blood (ACCU-CHEK AVIVA PLUS) test strip Use as instructed as instructed to test blood sugar 2 times daily 100 each 12  . hydrocortisone (ANUSOL-HC) 2.5 % rectal cream Place 1 application rectally 2 (two) times daily. 30 g 0  . lisinopril (PRINIVIL,ZESTRIL) 10 MG tablet Take 1 tablet (10 mg total) by mouth daily. For blood pressure. 30 tablet 0  . Metformin HCl 500 MG/5ML SOLN Take 500 mg by mouth 2 (two) times daily.    . norethindrone-ethinyl estradiol (JUNEL FE,GILDESS FE,LOESTRIN FE) 1-20 MG-MCG tablet Take 1 tablet by mouth daily.    . Prenatal MV & Min w/FA-DHA (PRENATAL ADULT GUMMY/DHA/FA PO) Take 2 tablets by mouth daily.     . Probiotic Product (PROBIOTIC-10) CAPS Take 1  capsule by mouth daily.     No current facility-administered medications on file prior to visit.     Review of Systems  Constitutional: Negative for activity change, appetite change, fatigue, fever and unexpected weight change.  HENT: Negative for congestion, ear pain, rhinorrhea, sinus pressure and sore throat.   Eyes: Negative for pain, redness and visual disturbance.  Respiratory: Negative for cough, shortness of breath and wheezing.   Cardiovascular: Negative for chest pain and palpitations.  Gastrointestinal: Negative for abdominal pain, blood in stool, constipation and diarrhea.  Endocrine: Negative for polydipsia and polyuria.  Genitourinary: Positive for dysuria, frequency and urgency. Negative for decreased urine volume, flank pain and pelvic pain.  Musculoskeletal: Negative for arthralgias, back pain and myalgias.  Skin: Negative for pallor and rash.  Allergic/Immunologic: Negative for environmental allergies.  Neurological: Negative for dizziness, syncope and headaches.  Hematological: Negative for adenopathy. Does not bruise/bleed easily.  Psychiatric/Behavioral: Negative for decreased concentration and dysphoric mood. The patient is not nervous/anxious.        Objective:   Physical Exam  Constitutional: She appears well-developed and well-nourished. No distress.  HENT:  Head: Normocephalic and atraumatic.  Eyes: Pupils are equal, round, and reactive to light. Conjunctivae and EOM are normal.  Neck: Normal range of motion. Neck supple.  Cardiovascular: Normal rate, regular rhythm and normal heart sounds.  Pulmonary/Chest: Effort normal and breath sounds normal.  Abdominal: Soft. Bowel sounds are normal. She exhibits no distension. There is tenderness. There is no rebound.  No cva tenderness  Mild suprapubic tenderness  Well healed CS incision  Musculoskeletal: She exhibits no edema.  Lymphadenopathy:    She has no cervical adenopathy.  Neurological: She is alert.    Skin: No rash noted. No erythema. No pallor.  Psychiatric: She has a normal mood and affect.          Assessment & Plan:   Problem List Items Addressed This Visit      Other   Dysuria - Primary    With recent recurrent uti since pregnancy and CS (not breast feeding) ua with blood/protein and small leuk cx pend  tx with bactrim DS pend result  If no imp- consider f/u with gyn  Disc ways to prevent utis       Relevant Orders   POCT Urinalysis Dipstick (Automated) (Completed)   Urine Culture

## 2018-05-31 NOTE — Assessment & Plan Note (Signed)
With recent recurrent uti since pregnancy and CS (not breast feeding) ua with blood/protein and small leuk cx pend  tx with bactrim DS pend result  If no imp- consider f/u with gyn  Disc ways to prevent utis

## 2018-05-31 NOTE — Patient Instructions (Addendum)
Drink lots of water  Avoid artificial sweetener, avoid very acidic beverages- these can cause more urinary burning   Take the bactrim DS as directed We will alert you when culture returns   Please call if symptoms worsen

## 2018-06-01 LAB — URINE CULTURE
MICRO NUMBER:: 91452303
SPECIMEN QUALITY:: ADEQUATE

## 2018-06-02 ENCOUNTER — Encounter: Payer: Self-pay | Admitting: Family Medicine

## 2018-06-03 ENCOUNTER — Other Ambulatory Visit: Payer: Self-pay | Admitting: Primary Care

## 2018-06-05 ENCOUNTER — Telehealth: Payer: Self-pay

## 2018-06-05 ENCOUNTER — Other Ambulatory Visit: Payer: 59

## 2018-06-05 NOTE — Telephone Encounter (Signed)
Rachel Rice, will you take a look? I guess [name] from any provider she's seen.

## 2018-06-05 NOTE — Telephone Encounter (Signed)
Patient called in to team health on 06/04/18 at 10am asking if she needs to hold her metformin for her MRI today as paperwork instructs.  Triage nurse, Terri PiedraKim Matherly, RN contacted patient back and informed her Yes that she should hold the medication as instructed.

## 2018-06-08 ENCOUNTER — Other Ambulatory Visit (INDEPENDENT_AMBULATORY_CARE_PROVIDER_SITE_OTHER): Payer: 59

## 2018-06-08 ENCOUNTER — Other Ambulatory Visit: Payer: Self-pay | Admitting: Primary Care

## 2018-06-08 DIAGNOSIS — N3000 Acute cystitis without hematuria: Secondary | ICD-10-CM

## 2018-06-08 DIAGNOSIS — N390 Urinary tract infection, site not specified: Secondary | ICD-10-CM

## 2018-06-08 LAB — POC URINALSYSI DIPSTICK (AUTOMATED)
Bilirubin, UA: NEGATIVE
Blood, UA: NEGATIVE
Glucose, UA: NEGATIVE
Ketones, UA: NEGATIVE
Nitrite, UA: NEGATIVE
Protein, UA: NEGATIVE
Spec Grav, UA: 1.02 (ref 1.010–1.025)
Urobilinogen, UA: 0.2 E.U./dL
pH, UA: 6 (ref 5.0–8.0)

## 2018-06-08 MED ORDER — CEPHALEXIN 500 MG PO CAPS: 500.0000 mg | ORAL_CAPSULE | Freq: Two times a day (BID) | ORAL | 0 refills | Status: DC

## 2018-06-08 NOTE — Assessment & Plan Note (Signed)
Continued urinary frequency since treatment with Bactrim tablets. Also with pelvic pressure and nausea. Urine culture returned positive with streptococcus agalactiae. No sensitivity report.  UA today with 2+ leuks, otherwise negative. Culture sent. Given continued symptoms will send Rx for Keflex for treatment.  She will follow up Monday next week if no improvement.

## 2018-06-10 LAB — URINE CULTURE
MICRO NUMBER:: 91489790
SPECIMEN QUALITY:: ADEQUATE

## 2018-06-13 ENCOUNTER — Telehealth: Payer: Self-pay | Admitting: Primary Care

## 2018-06-13 ENCOUNTER — Ambulatory Visit (INDEPENDENT_AMBULATORY_CARE_PROVIDER_SITE_OTHER): Payer: 59 | Admitting: Primary Care

## 2018-06-13 ENCOUNTER — Encounter: Payer: Self-pay | Admitting: Primary Care

## 2018-06-13 ENCOUNTER — Other Ambulatory Visit: Payer: Self-pay | Admitting: Primary Care

## 2018-06-13 VITALS — BP 124/84 | HR 90 | Temp 98.0°F | Ht 65.0 in | Wt 179.8 lb

## 2018-06-13 DIAGNOSIS — E119 Type 2 diabetes mellitus without complications: Secondary | ICD-10-CM

## 2018-06-13 DIAGNOSIS — R109 Unspecified abdominal pain: Secondary | ICD-10-CM | POA: Diagnosis not present

## 2018-06-13 DIAGNOSIS — N390 Urinary tract infection, site not specified: Secondary | ICD-10-CM | POA: Diagnosis not present

## 2018-06-13 LAB — BASIC METABOLIC PANEL WITH GFR
BUN: 12 mg/dL (ref 6–23)
CO2: 26 meq/L (ref 19–32)
Calcium: 9 mg/dL (ref 8.4–10.5)
Chloride: 105 meq/L (ref 96–112)
Creatinine, Ser: 0.74 mg/dL (ref 0.40–1.20)
GFR: 92.67 mL/min
Glucose, Bld: 159 mg/dL — ABNORMAL HIGH (ref 70–99)
Potassium: 3.9 meq/L (ref 3.5–5.1)
Sodium: 139 meq/L (ref 135–145)

## 2018-06-13 LAB — CBC WITH DIFFERENTIAL/PLATELET
Basophils Absolute: 0 K/uL (ref 0.0–0.1)
Basophils Relative: 0.4 % (ref 0.0–3.0)
Eosinophils Absolute: 0.7 K/uL (ref 0.0–0.7)
Eosinophils Relative: 9.6 % — ABNORMAL HIGH (ref 0.0–5.0)
HCT: 36.9 % (ref 36.0–46.0)
Hemoglobin: 12 g/dL (ref 12.0–15.0)
Lymphocytes Relative: 28.3 % (ref 12.0–46.0)
Lymphs Abs: 2.1 K/uL (ref 0.7–4.0)
MCHC: 32.4 g/dL (ref 30.0–36.0)
MCV: 86.6 fl (ref 78.0–100.0)
Monocytes Absolute: 0.5 K/uL (ref 0.1–1.0)
Monocytes Relative: 6.7 % (ref 3.0–12.0)
Neutro Abs: 4 K/uL (ref 1.4–7.7)
Neutrophils Relative %: 55 % (ref 43.0–77.0)
Platelets: 310 K/uL (ref 150.0–400.0)
RBC: 4.26 Mil/uL (ref 3.87–5.11)
RDW: 17.3 % — ABNORMAL HIGH (ref 11.5–15.5)
WBC: 7.3 K/uL (ref 4.0–10.5)

## 2018-06-13 LAB — HEMOGLOBIN A1C: HEMOGLOBIN A1C: 6.3 % (ref 4.6–6.5)

## 2018-06-13 NOTE — Telephone Encounter (Signed)
Rachel Rice, this patient will be undergoing an MR of her abdomen on 06/16/18 for evaluation of a liver lesion. I'd like to capture kidneys and ureters for renal stone evaluation. Will this capture?

## 2018-06-13 NOTE — Assessment & Plan Note (Signed)
Completed cephalexin course, feeling better overall. Some residual symptoms. No additional antibiotics needed for now, especially since she's had recurrent antibiotics since early November.

## 2018-06-13 NOTE — Assessment & Plan Note (Signed)
Repeat A1C pending today, compliant to liquid Metformin 500 mg BID.  Consider dose increase to 1000 mg BID. She will hold her Metformin 48 hours prior to MR of the abdomen.

## 2018-06-13 NOTE — Progress Notes (Signed)
Subjective:    Patient ID: Rachel Rice, female    DOB: [birth year], 37 y.o.   MRN: [id number]  HPI  Rachel Rice is a 37 year old female who is post partum since late October 2019 who presents today for follow up of urinary symptoms.  She was evaluated in our office on 05/31/18 with complaints of dysuria. UA was suspicious so she was treated with sulfamethoxazole-trimethoprim 800-160 mg tablets. Culture came back with streptococcus agalactiae.   She sent a message through my chart on 06/06/18 with reports of urinary urgency despite completing her antibiotic course of sulfamethoxazole-trimethoprim. She returned on 06/08/18 and provided [place] with a urine specimen which showed 2+ leuks, otherwise negative. Given her symptoms she was initiated on cephalexin 500 mg BID x 7 day course. Culture returned with negative staphylococcus.   Since initiation of cephalexin she noticed improvement in her urinary urgency. She's noticed right side pain, generalized abdominal discomfort. She was evaluated by [facility] last week, discharged from their practice.  She was initially evaluated and treated for UTI on 05/03/18, completed the course. Evaluated again prior to Thanksgiving, treated for UTI and completed antibiotic course. Her next visit was on 05/31/18, treated with antibiotics. Treated again with antibiotics on 06/08/18.  She denies fevers, dysuria. She has noticed vaginal spotting since delivery. She will be undergoing MR of her abdomen on 06/16/18.  She's been checking her glucose levels 1-2 times daily which are ranging: AM fasting: 130-160's 2 hours after meals: 115's-140's  Review of Systems  Constitutional: Negative for fever.  Respiratory: Negative for cough.   Gastrointestinal: Positive for nausea.       Abdominal discomfort  Genitourinary: Positive for flank pain and urgency. Negative for dysuria.       Past Medical History:  Diagnosis Date  . Advanced maternal age, 1st pregnancy,  second trimester   . Diabetes mellitus without complication ([place])   . GERD (gastroesophageal reflux disease)   . Hypertension   . Preeclampsia in postpartum period   . Preeclampsia in postpartum period      Social History   Socioeconomic History  . Marital status: Married    Spouse name: Not on file  . Number of children: Not on file  . Years of education: Not on file  . Highest education level: Not on file  Occupational History  . Not on file  Social Needs  . Financial resource strain: Not on file  . Food insecurity:    Worry: Not on file    Inability: Not on file  . Transportation needs:    Medical: Not on file    Non-medical: Not on file  Tobacco Use  . Smoking status: Never Smoker  . Smokeless tobacco: Never Used  Substance and Sexual Activity  . Alcohol use: Not Currently    Comment: rare  . Drug use: No  . Sexual activity: Yes  Lifestyle  . Physical activity:    Days per week: Not on file    Minutes per session: Not on file  . Stress: Not on file  Relationships  . Social connections:    Talks on phone: Not on file    Gets together: Not on file    Attends religious service: Not on file    Active member of club or organization: Not on file    Attends meetings of clubs or organizations: Not on file    Relationship status: Not on file  . Intimate partner violence:    Fear of current  or ex partner: Not on file    Emotionally abused: Not on file    Physically abused: Not on file    Forced sexual activity: Not on file  Other Topics Concern  . Not on file  Social History Narrative   Married.    No Children.   Claims adjuster.    Past Surgical History:  Procedure Laterality Date  . CESAREAN SECTION    . TYMPANOSTOMY TUBE PLACEMENT      Family History  Problem Relation Age of Onset  . Ovarian cancer Paternal Grandmother 78  . Hypertension Mother   . Hypertension Father   . Diabetes Father        secondary to agent orange exposure  . Hypertension  Brother   . Heart disease Paternal Grandfather   . Hypertension Brother   . Breast cancer Neg Hx     Allergies  Allergen Reactions  . Fluticasone Other (See Comments)    nosebleed nosebleed  Other reaction(s): Other (See Comments) nosebleed  . Iodinated Diagnostic Agents Hives  . Gadolinium Derivatives     Other reaction(s): Other (See Comments)  . Latex Rash    Current Outpatient Medications on File Prior to Visit  Medication Sig Dispense Refill  . ACCU-CHEK SOFTCLIX LANCETS lancets Use as instructed to test blood sugar 2 times daily E11.9 200 each 2  . Blood Glucose Monitoring Suppl (ACCU-CHEK AVIVA PLUS) w/Device KIT Use as instructed to test blood sugar daily 1 kit 0  . cephALEXin (KEFLEX) 500 MG capsule Take 1 capsule (500 mg total) by mouth 2 (two) times daily. 14 capsule 0  . glucose blood (ACCU-CHEK AVIVA PLUS) test strip Use as instructed as instructed to test blood sugar 2 times daily 100 each 12  . hydrocortisone (ANUSOL-HC) 2.5 % rectal cream Place 1 application rectally 2 (two) times daily. 30 g 0  . lisinopril (PRINIVIL,ZESTRIL) 10 MG tablet Take 1 tablet (10 mg total) by mouth daily. For blood pressure. 30 tablet 0  . Metformin HCl 500 MG/5ML SOLN Take 500 mg by mouth 2 (two) times daily.    . norethindrone-ethinyl estradiol (JUNEL FE,GILDESS FE,LOESTRIN FE) 1-20 MG-MCG tablet Take 1 tablet by mouth daily.    . Prenatal MV & Min w/FA-DHA (PRENATAL ADULT GUMMY/DHA/FA PO) Take 2 tablets by mouth daily.     . Probiotic Product (PROBIOTIC-10) CAPS Take 1 capsule by mouth daily.     No current facility-administered medications on file prior to visit.     BP 124/84   Pulse 90   Temp 98 F (36.7 C) (Oral)   Ht '5\' 5"'$  (1.651 m)   Wt 179 lb 12 oz (81.5 kg)   LMP 08/07/2017   SpO2 98%   Breastfeeding No   BMI 29.91 kg/m    Objective:   Physical Exam  Constitutional: She appears well-nourished.  Neck: Neck supple.  Cardiovascular: Normal rate and regular  rhythm.  Respiratory: Effort normal and breath sounds normal.  GI: Soft. Normal appearance and bowel sounds are normal. There is generalized abdominal tenderness. There is CVA tenderness.  General mild tenderness to abdomen. Mild CVA tenderness bilaterally.  Skin: Skin is warm and dry.           Assessment & Plan:

## 2018-06-13 NOTE — Telephone Encounter (Signed)
Called [facility] and they added comments for the Technologist to capture the kidneys and ureters.

## 2018-06-13 NOTE — Telephone Encounter (Signed)
Jae DireKate, they recommend that you call the Radiologist at [phone] to ask your question.

## 2018-06-13 NOTE — Patient Instructions (Signed)
Stop by the lab prior to leaving today. I will notify you of your results once received.   I'll be in touch once I receive your MRI results.   It was a pleasure to see you today!

## 2018-06-13 NOTE — Assessment & Plan Note (Signed)
Right non obstructing renal stone from CT scan in November 2019. She will undergo MR of the abdomen in 3 days for liver lesion, will alter note to evaluate for kidney stones. Hold metformin 48 hours prior, she verbalized understanding. Check CBC and BMP today.

## 2018-06-13 NOTE — Telephone Encounter (Signed)
Spoke with the radiologist who recommends we proceed with MR abdomen.  We do need to mention to the technologist that we are interested in her kidneys as well. Do I need to put in another order with mention to focus on the kidneys or can we relay the message?

## 2018-06-16 ENCOUNTER — Ambulatory Visit
Admission: RE | Admit: 2018-06-16 | Discharge: 2018-06-16 | Disposition: A | Discharge status: Home / Self Care | Payer: 59 | Source: Ambulatory Visit | Attending: Primary Care | Admitting: Primary Care

## 2018-06-16 DIAGNOSIS — K769 Liver disease, unspecified: Secondary | ICD-10-CM

## 2018-06-16 NOTE — Telephone Encounter (Signed)
Pt left v/m ;that pt cannot take the med prior to the MRI that is scheduled for Sun. 06/18/18. Pt hit brick wall at radiology about different med or what to do. Pt request cb to know how important the MRI is.

## 2018-06-18 ENCOUNTER — Ambulatory Visit
Admission: RE | Admit: 2018-06-18 | Discharge: 2018-06-18 | Disposition: A | Discharge status: Home / Self Care | Payer: 59 | Source: Ambulatory Visit | Attending: Primary Care | Admitting: Primary Care

## 2018-06-18 MED ORDER — GADOBENATE DIMEGLUMINE 529 MG/ML IV SOLN
17.0000 mL | Freq: Once | INTRAVENOUS | Status: AC | PRN
  Administered 2018-06-18: 17 mL via INTRAVENOUS

## 2018-06-19 ENCOUNTER — Telehealth: Payer: Self-pay

## 2018-06-19 NOTE — Telephone Encounter (Signed)
Nothing to worry about.  This is a term that can be used for an empty gallbladder when the bile has been secreted out.  This can occur after a meal.

## 2018-06-19 NOTE — Telephone Encounter (Signed)
[facility] faxed note about elevated BS of almost 300 on 06/18/18;pt could not take metformin due to getting MRI. Unable to reach pt by phone. [facility] Pa[facility] for FU.

## 2018-06-19 NOTE — Telephone Encounter (Signed)
Pt called back. Pt did not take FBS 06/19/18. Last night BS was 158.pt had taken steroid prior to MRI. Now at 10:30 AM pt took BS 168. Pt last ate 06/19/18 9AM and ate sausage and eggs.Endocrinologist advised pt not to take metformin 40 hrs after MRI. Pt has insulin at home and will ck with Endo about the need for taking insulin. FYI to Allayne GitelmanK Clark NP.

## 2018-06-19 NOTE — Telephone Encounter (Signed)
Pt called to get MRI report. I read the mychart report to pt. Pt had a question and wants to know what does the GB collapsed mean? Is that something that is concerning.

## 2018-06-19 NOTE — Telephone Encounter (Signed)
Spoken and notified patient of Kate Clark's comments. Patient verbalized understanding.  

## 2018-06-22 ENCOUNTER — Other Ambulatory Visit: Payer: 59

## 2018-06-24 ENCOUNTER — Other Ambulatory Visit: Payer: 59

## 2018-06-26 ENCOUNTER — Ambulatory Visit (INDEPENDENT_AMBULATORY_CARE_PROVIDER_SITE_OTHER): Payer: 59 | Admitting: Internal Medicine

## 2018-06-26 ENCOUNTER — Encounter: Payer: Self-pay | Admitting: Internal Medicine

## 2018-06-26 VITALS — BP 114/76 | HR 95 | Temp 97.8°F | Wt 177.0 lb

## 2018-06-26 DIAGNOSIS — R197 Diarrhea, unspecified: Secondary | ICD-10-CM | POA: Diagnosis not present

## 2018-06-26 DIAGNOSIS — B370 Candidal stomatitis: Secondary | ICD-10-CM

## 2018-06-26 MED ORDER — NYSTATIN 100000 UNIT/ML MT SUSP: 5.0000 mL | Freq: Four times a day (QID) | OROMUCOSAL | 0 refills | Status: DC

## 2018-06-26 NOTE — Progress Notes (Signed)
Subjective:    Patient ID: Rachel Rice, female    DOB: 1981-04-12, 37 y.o.   MRN: [id number]  HPI  Pt presents to the clinic today with c/o a white coating on her tongue. She noticed this 1 week ago. She reports she was recently on abx for a UTI, she had to hold her Metformin for 1 week for a MRI due to the contrast. She reports she also had to take prep for the contrast because she is allergic to it. She denies difficulty swallowing. She has been using Tea Tree Oil mouth rinse and taking a Probiotic with some relief.  She also reports persistent diarrhea since her MRI 1 week ago. She can have up to 2-3 loose stools per day. She denies abdominal cramping, nausea, vomiting or blood in her stools. She did restart her Metformin after the MRI and thinks this is the cause of her diarrhea. She has never had diarrhea with Metformin before. She is taking Imodium with some relief.  Review of Systems      Past Medical History:  Diagnosis Date  . Advanced maternal age, 1st pregnancy, second trimester   . Diabetes mellitus without complication ([place])   . GERD (gastroesophageal reflux disease)   . Hypertension   . Preeclampsia in postpartum period   . Preeclampsia in postpartum period     Current Outpatient Medications  Medication Sig Dispense Refill  . ACCU-CHEK SOFTCLIX LANCETS lancets Use as instructed to test blood sugar 2 times daily E11.9 200 each 2  . Blood Glucose Monitoring Suppl (ACCU-CHEK AVIVA PLUS) w/Device KIT Use as instructed to test blood sugar daily 1 kit 0  . glucose blood (ACCU-CHEK AVIVA PLUS) test strip Use as instructed as instructed to test blood sugar 2 times daily 100 each 12  . hydrocortisone (ANUSOL-HC) 2.5 % rectal cream Place 1 application rectally 2 (two) times daily. 30 g 0  . lisinopril (PRINIVIL,ZESTRIL) 10 MG tablet Take 1 tablet (10 mg total) by mouth daily. For blood pressure. 30 tablet 0  . Metformin HCl 500 MG/5ML SOLN Take 500 mg by mouth 2 (two) times  daily.    . norethindrone-ethinyl estradiol (JUNEL FE,GILDESS FE,LOESTRIN FE) 1-20 MG-MCG tablet Take 1 tablet by mouth daily.    . Prenatal MV & Min w/FA-DHA (PRENATAL ADULT GUMMY/DHA/FA PO) Take 2 tablets by mouth daily.     . Probiotic Product (PROBIOTIC-10) CAPS Take 1 capsule by mouth daily.    Marland Kitchen nystatin (MYCOSTATIN) 100000 UNIT/ML suspension Take 5 mLs (500,000 Units total) by mouth 4 (four) times daily. 100 mL 0   No current facility-administered medications for this visit.     Allergies  Allergen Reactions  . Fluticasone Other (See Comments)    nosebleed nosebleed  Other reaction(s): Other (See Comments) nosebleed  . Iodinated Diagnostic Agents Hives  . Gadolinium Derivatives     Other reaction(s): Other (See Comments)  . Latex Rash    Family History  Problem Relation Age of Onset  . Ovarian cancer Paternal Grandmother 7  . Hypertension Mother   . Hypertension Father   . Diabetes Father        secondary to agent orange exposure  . Hypertension Brother   . Heart disease Paternal Grandfather   . Hypertension Brother   . Breast cancer Neg Hx     Social History   Socioeconomic History  . Marital status: Married    Spouse name: Not on file  . Number of children: Not on file  .  Years of education: Not on file  . Highest education level: Not on file  Occupational History  . Not on file  Social Needs  . Financial resource strain: Not on file  . Food insecurity:    Worry: Not on file    Inability: Not on file  . Transportation needs:    Medical: Not on file    Non-medical: Not on file  Tobacco Use  . Smoking status: Never Smoker  . Smokeless tobacco: Never Used  Substance and Sexual Activity  . Alcohol use: Not Currently    Comment: rare  . Drug use: No  . Sexual activity: Yes  Lifestyle  . Physical activity:    Days per week: Not on file    Minutes per session: Not on file  . Stress: Not on file  Relationships  . Social connections:    Talks on  phone: Not on file    Gets together: Not on file    Attends religious service: Not on file    Active member of club or organization: Not on file    Attends meetings of clubs or organizations: Not on file    Relationship status: Not on file  . Intimate partner violence:    Fear of current or ex partner: Not on file    Emotionally abused: Not on file    Physically abused: Not on file    Forced sexual activity: Not on file  Other Topics Concern  . Not on file  Social History Narrative   Married.    No Children.   Claims adjuster.     Constitutional: Denies fever, malaise, fatigue, headache or abrupt weight changes.  HEENT: Pt reports white coating on tongue. Denies eye pain, eye redness, ear pain, ringing in the ears, wax buildup, runny nose, nasal congestion, bloody nose, or sore throat. Respiratory: Denies difficulty breathing, shortness of breath, cough or sputum production.   Cardiovascular: Denies chest pain, chest tightness, palpitations or swelling in the hands or feet.  Gastrointestinal: Pt reports diarrhea. Denies abdominal pain, bloating, constipation, or blood in the stool.   No other specific complaints in a complete review of systems (except as listed in HPI above).  Objective:   Physical Exam  BP 114/76   Pulse 95   Temp 97.8 F (36.6 C) (Oral)   Wt 177 lb (80.3 kg)   LMP 06/12/2018   SpO2 98%   BMI 29.45 kg/m  Wt Readings from Last 3 Encounters:  06/26/18 177 lb (80.3 kg)  06/13/18 179 lb 12 oz (81.5 kg)  05/31/18 179 lb 8 oz (81.4 kg)    General: Appears her stated age, well developed, well nourished in NAD. HEENT: Head: normal shape and size; Throat/Mouth: Teeth present, mucosa pink and moist, no lesions or ulcerations noted. White coating noted on tongue. Neck:  No adenopathy noted.  Cardiovascular: Normal rate and rhythm. S1,S2 noted.  No murmur, rubs or gallops noted.  Pulmonary/Chest: Normal effort and positive vesicular breath sounds. No  respiratory distress. No wheezes, rales or ronchi noted.  Abdomen: Soft and nontender. Normal bowel sounds. No distention or masses noted.   BMET    Component Value Date/Time   NA 139 06/13/2018 0948   K 3.9 06/13/2018 0948   CL 105 06/13/2018 0948   CO2 26 06/13/2018 0948   GLUCOSE 159 (H) 06/13/2018 0948   BUN 12 06/13/2018 0948   CREATININE 0.74 06/13/2018 0948   CALCIUM 9.0 06/13/2018 0948   GFRNONAA >60 05/13/2018 2158  GFRAA >60 05/13/2018 2158    Lipid Panel     Component Value Date/Time   CHOL 163 11/28/2017 0802   TRIG 176.0 (H) 11/28/2017 0802   HDL 54.60 11/28/2017 0802   CHOLHDL 3 11/28/2017 0802   VLDL 35.2 11/28/2017 0802   LDLCALC 73 11/28/2017 0802    CBC    Component Value Date/Time   WBC 7.3 06/13/2018 0948   RBC 4.26 06/13/2018 0948   HGB 12.0 06/13/2018 0948   HCT 36.9 06/13/2018 0948   PLT 310.0 06/13/2018 0948   MCV 86.6 06/13/2018 0948   MCH 28.3 05/13/2018 2158   MCHC 32.4 06/13/2018 0948   RDW 17.3 (H) 06/13/2018 0948   LYMPHSABS 2.1 06/13/2018 0948   MONOABS 0.5 06/13/2018 0948   EOSABS 0.7 06/13/2018 0948   BASOSABS 0.0 06/13/2018 0948    Hgb A1C Lab Results  Component Value Date   HGBA1C 6.3 06/13/2018           Assessment & Plan:   Oral Thrush:  RX for Nystatin Swish and Swallow 4 x day for 5 days  Diarrhea:  Likely more related to the contrast vs Metformin as she has never had diarrhea with Metformin in the past Encouraged her to consume a bland diet Ok to continue Imodium OTC as directed If diarrhea persist, can change Metformin to another medication per PCP discreation  Return precautions discussed Webb Silversmith, NP

## 2018-06-26 NOTE — Patient Instructions (Signed)
Oral Thrush, Adult  Oral thrush is an infection in your mouth and throat. It causes white patches on your tongue and in your mouth. Follow these instructions at home: Helping with soreness   To lessen your pain: ? Drink cold liquids, like water and iced tea. ? Eat frozen ice pops or frozen juices. ? Eat foods that are easy to swallow, like gelatin and ice cream. ? Drink from a straw if the patches in your mouth are painful. General instructions  Take or use over-the-counter and prescription medicines only as told by your doctor. Medicine for oral thrush may be something to swallow, or it may be something to put on the infected area.  Eat plain yogurt that has live cultures in it. Read the label to make sure.  If you wear dentures: ? Take out your dentures before you go to bed. ? Brush them well. ? Soak them in a denture cleaner.  Rinse your mouth with warm salt-water many times a day. To make the salt-water mixture, completely dissolve 1/2-1 teaspoon of salt in 1 cup of warm water. Contact a doctor if:  Your problems are getting worse.  Your problems do not get better in less than 7 days with treatment.  Your infection is spreading. This may show as white patches on the skin outside of your mouth.  You are nursing your baby and you have redness and pain in the nipples. This information is not intended to replace advice given to you by your health care provider. Make sure you discuss any questions you have with your health care provider. Document Released: 09/08/2009 Document Revised: 03/08/2016 Document Reviewed: 03/08/2016 Elsevier Interactive Patient Education  2019 Elsevier Inc.  

## 2018-06-29 NOTE — Telephone Encounter (Signed)
Rachel Rice, will you get her scheduled with anybody that has an opening for either today or tomorrow?

## 2018-06-30 ENCOUNTER — Encounter: Payer: Self-pay | Admitting: Internal Medicine

## 2018-06-30 ENCOUNTER — Ambulatory Visit (INDEPENDENT_AMBULATORY_CARE_PROVIDER_SITE_OTHER): Payer: 59 | Admitting: Internal Medicine

## 2018-06-30 VITALS — BP 118/82 | HR 103 | Temp 97.8°F | Ht 65.0 in | Wt 177.0 lb

## 2018-06-30 DIAGNOSIS — R109 Unspecified abdominal pain: Secondary | ICD-10-CM | POA: Insufficient documentation

## 2018-06-30 LAB — POC URINALSYSI DIPSTICK (AUTOMATED)
Bilirubin, UA: NEGATIVE
Glucose, UA: NEGATIVE
Ketones, UA: NEGATIVE
Leukocytes, UA: NEGATIVE
Nitrite, UA: NEGATIVE
PH UA: 5.5 (ref 5.0–8.0)
Protein, UA: NEGATIVE
RBC UA: NEGATIVE
Spec Grav, UA: 1.025 (ref 1.010–1.025)
Urobilinogen, UA: 0.2 E.U./dL

## 2018-06-30 NOTE — Assessment & Plan Note (Signed)
No urinary symptoms and urinalysis benign No stones Seems to be related to ribs/chest wall Discussed analgesics/heat

## 2018-06-30 NOTE — Progress Notes (Signed)
Subjective:    Patient ID: Rachel Rice, female    DOB: 13-Jun-1981, 38 y.o.   MRN: [id number]  HPI Here due to flank pain on right Also in right CVA area  Had similar pain when pregnant recently--delivered in October and got sick in November Did have some specific urinary symptoms then (but had catheter)  Started off and on for the past month Got more sharp 2 days ago--in bed No fever No dysuria, hematuria, urgency No radiation of pain No exacerbating factors Does seem to ease up on its own  Current Outpatient Medications on File Prior to Visit  Medication Sig Dispense Refill  . ACCU-CHEK SOFTCLIX LANCETS lancets Use as instructed to test blood sugar 2 times daily E11.9 200 each 2  . Blood Glucose Monitoring Suppl (ACCU-CHEK AVIVA PLUS) w/Device KIT Use as instructed to test blood sugar daily 1 kit 0  . glucose blood (ACCU-CHEK AVIVA PLUS) test strip Use as instructed as instructed to test blood sugar 2 times daily 100 each 12  . hydrocortisone (ANUSOL-HC) 2.5 % rectal cream Place 1 application rectally 2 (two) times daily. 30 g 0  . lisinopril (PRINIVIL,ZESTRIL) 10 MG tablet Take 1 tablet (10 mg total) by mouth daily. For blood pressure. 30 tablet 0  . Metformin HCl 500 MG/5ML SOLN Take 500 mg by mouth 2 (two) times daily.    . Multiple Vitamin (MULTIVITAMIN) capsule Take 1 capsule by mouth daily.    . norethindrone-ethinyl estradiol (JUNEL FE,GILDESS FE,LOESTRIN FE) 1-20 MG-MCG tablet Take 1 tablet by mouth daily.    Marland Kitchen nystatin (MYCOSTATIN) 100000 UNIT/ML suspension Take 5 mLs (500,000 Units total) by mouth 4 (four) times daily. 100 mL 0  . Probiotic Product (PROBIOTIC-10) CAPS Take 1 capsule by mouth daily.     No current facility-administered medications on file prior to visit.     Allergies  Allergen Reactions  . Fluticasone Other (See Comments)    nosebleed nosebleed  Other reaction(s): Other (See Comments) nosebleed  . Iodinated Diagnostic Agents Hives  .  Gadolinium Derivatives     Other reaction(s): Other (See Comments)  . Latex Rash    Past Medical History:  Diagnosis Date  . Advanced maternal age, 1st pregnancy, second trimester   . Diabetes mellitus without complication ([place])   . GERD (gastroesophageal reflux disease)   . Hypertension   . Preeclampsia in postpartum period   . Preeclampsia in postpartum period     Past Surgical History:  Procedure Laterality Date  . CESAREAN SECTION    . TYMPANOSTOMY TUBE PLACEMENT      Family History  Problem Relation Age of Onset  . Ovarian cancer Paternal Grandmother 32  . Hypertension Mother   . Hypertension Father   . Diabetes Father        secondary to agent orange exposure  . Hypertension Brother   . Heart disease Paternal Grandfather   . Hypertension Brother   . Breast cancer Neg Hx     Social History   Socioeconomic History  . Marital status: Married    Spouse name: Not on file  . Number of children: Not on file  . Years of education: Not on file  . Highest education level: Not on file  Occupational History  . Not on file  Social Needs  . Financial resource strain: Not on file  . Food insecurity:    Worry: Not on file    Inability: Not on file  . Transportation needs:    Medical: Not  on file    Non-medical: Not on file  Tobacco Use  . Smoking status: Never Smoker  . Smokeless tobacco: Never Used  Substance and Sexual Activity  . Alcohol use: Not Currently    Comment: rare  . Drug use: No  . Sexual activity: Yes  Lifestyle  . Physical activity:    Days per week: Not on file    Minutes per session: Not on file  . Stress: Not on file  Relationships  . Social connections:    Talks on phone: Not on file    Gets together: Not on file    Attends religious service: Not on file    Active member of club or organization: Not on file    Attends meetings of clubs or organizations: Not on file    Relationship status: Not on file  . Intimate partner violence:     Fear of current or ex partner: Not on file    Emotionally abused: Not on file    Physically abused: Not on file    Forced sexual activity: Not on file  Other Topics Concern  . Not on file  Social History Narrative   Married.    No Children.   Claims adjuster.   Review of Systems Bottle feeding Hold baby more on right side No history of kidney stones No fall or injuries No cough or SOB    Objective:   Physical Exam  Respiratory: Effort normal and breath sounds normal. No respiratory distress. She has no wheezes. She has no rales.  Some tenderness along lateral lower right ribs--worse with arm abduction and rotation  GI: Soft. There is no abdominal tenderness.           Assessment & Plan:

## 2018-07-04 DIAGNOSIS — B370 Candidal stomatitis: Secondary | ICD-10-CM

## 2018-07-04 MED ORDER — FLUCONAZOLE 100 MG PO TABS: ORAL_TABLET | ORAL | 0 refills | Status: DC

## 2018-07-12 ENCOUNTER — Ambulatory Visit (INDEPENDENT_AMBULATORY_CARE_PROVIDER_SITE_OTHER): Payer: 59 | Admitting: Primary Care

## 2018-07-12 ENCOUNTER — Encounter: Payer: Self-pay | Admitting: Primary Care

## 2018-07-12 VITALS — BP 128/84 | HR 87 | Temp 98.0°F | Wt 174.8 lb

## 2018-07-12 DIAGNOSIS — E119 Type 2 diabetes mellitus without complications: Secondary | ICD-10-CM

## 2018-07-12 DIAGNOSIS — L600 Ingrowing nail: Secondary | ICD-10-CM | POA: Diagnosis not present

## 2018-07-12 MED ORDER — METFORMIN HCL 500 MG/5ML PO SOLN: ORAL | 1 refills | Status: DC

## 2018-07-12 NOTE — Assessment & Plan Note (Signed)
Increase in fasting morning glucose readings over the last month. Will increase metformin to 500 mg in the morning and 1000 mg at bedtime. We will repeat A1c in 2 months.

## 2018-07-12 NOTE — Patient Instructions (Signed)
We've changed your Metformin dose to 500 mg (7ml) in the morning and 1000 mg (10 ml) in the evening.  Start soaking your toenail as discussed. Continue topical neosporin.   Please message me if you notice increased redness, drainage, pain.   You will be contacted regarding your referral to podiatry.  Please let us know if you have not been contacted within one week.   It was a pleasure to see you today!

## 2018-07-12 NOTE — Assessment & Plan Note (Signed)
History of years ago. Today with evidence of mild irritation.  No obvious infection. We will treat conservatively with Epson salt soaks and Neosporin as this has historically worked. Referral placed to podiatry for diabetes foot care and also evaluation of ingrown toenails. We discussed return precautions.

## 2018-07-12 NOTE — Progress Notes (Signed)
Subjective:    Patient ID: Rachel Rice, female    DOB: Jul 05, 1980, 38 y.o.   MRN: [id number]  HPI  [name] is a 38 year old female who presents today with a chief complaint of ingrown toenail. She would also like to discuss her diabetes.  1) Type 2 Diabetes: Diagnosed in late 2016. Currently managed on liquid Metformin 500 mg BID. Her last A1C was 6.3 in mid December 2019 (one month ago). She had to hold her Metformin for an MRI recently, also had to take prednisone per prophylaxis protocol. Was off of her Metformin for one week total.   She's been checking her glucose three times daily: AM fasting: 158, 139, 153, 162, 149, 179, 140 2 hours after lunch and evening: 118, 140, 129, 139  Typically her AM fasting glucose readings are in the 120's. She denies abrupt changes in her diet or lifestyle.   2) Toenail Pain: Her pain is located to the lateral border of her left great toenail. She has a history of ingrown toenails, once saw podiatry. She was able to "take care" of her ingrown toenails in the past with epsom salt soaks and filing down her nails. Her last ingrown toenail was several years ago.   For her recent ingrown toenail she's tried applying neosporin, today has noticed less redness and pain. She denies fevers, drainage from the site.   Review of Systems  Constitutional: Negative for fever.  Respiratory: Negative for shortness of breath.   Cardiovascular: Negative for chest pain.  Skin: Positive for color change.       Left great toenail and toe pain, redness       Past Medical History:  Diagnosis Date  . Advanced maternal age, 1st pregnancy, second trimester   . Diabetes mellitus without complication ([place])   . GERD (gastroesophageal reflux disease)   . Hypertension   . Preeclampsia in postpartum period   . Preeclampsia in postpartum period      Social History   Socioeconomic History  . Marital status: Married    Spouse name: Not on file  . Number of children:  Not on file  . Years of education: Not on file  . Highest education level: Not on file  Occupational History  . Not on file  Social Needs  . Financial resource strain: Not on file  . Food insecurity:    Worry: Not on file    Inability: Not on file  . Transportation needs:    Medical: Not on file    Non-medical: Not on file  Tobacco Use  . Smoking status: Never Smoker  . Smokeless tobacco: Never Used  Substance and Sexual Activity  . Alcohol use: Not Currently    Comment: rare  . Drug use: No  . Sexual activity: Yes  Lifestyle  . Physical activity:    Days per week: Not on file    Minutes per session: Not on file  . Stress: Not on file  Relationships  . Social connections:    Talks on phone: Not on file    Gets together: Not on file    Attends religious service: Not on file    Active member of club or organization: Not on file    Attends meetings of clubs or organizations: Not on file    Relationship status: Not on file  . Intimate partner violence:    Fear of current or ex partner: Not on file    Emotionally abused: Not on file  Physically abused: Not on file    Forced sexual activity: Not on file  Other Topics Concern  . Not on file  Social History Narrative   Married.    No Children.   Claims adjuster.    Past Surgical History:  Procedure Laterality Date  . CESAREAN SECTION    . TYMPANOSTOMY TUBE PLACEMENT      Family History  Problem Relation Age of Onset  . Ovarian cancer Paternal Grandmother 77  . Hypertension Mother   . Hypertension Father   . Diabetes Father        secondary to agent orange exposure  . Hypertension Brother   . Heart disease Paternal Grandfather   . Hypertension Brother   . Breast cancer Neg Hx     Allergies  Allergen Reactions  . Fluticasone Other (See Comments)    nosebleed nosebleed  Other reaction(s): Other (See Comments) nosebleed  . Iodinated Diagnostic Agents Hives  . Gadolinium Derivatives     Other  reaction(s): Other (See Comments)  . Latex Rash    Current Outpatient Medications on File Prior to Visit  Medication Sig Dispense Refill  . aspirin EC 81 MG tablet Take 81 mg by mouth daily.    . Blood Glucose Monitoring Suppl (ACCU-CHEK AVIVA PLUS) w/Device KIT Use as instructed to test blood sugar daily 1 kit 0  . fluconazole (DIFLUCAN) 100 MG tablet Take 2 tablets on day 1, then 1 tablet once daily for 6 additional days. 8 tablet 0  . glucose blood (ACCU-CHEK AVIVA PLUS) test strip Use as instructed as instructed to test blood sugar 2 times daily 100 each 12  . hydrocortisone (ANUSOL-HC) 2.5 % rectal cream Place 1 application rectally 2 (two) times daily. 30 g 0  . lisinopril (PRINIVIL,ZESTRIL) 10 MG tablet Take 1 tablet (10 mg total) by mouth daily. For blood pressure. 30 tablet 0  . Multiple Vitamin (MULTIVITAMIN) capsule Take 1 capsule by mouth daily.    . norethindrone-ethinyl estradiol (JUNEL FE,GILDESS FE,LOESTRIN FE) 1-20 MG-MCG tablet Take 1 tablet by mouth daily.    Marland Kitchen nystatin (MYCOSTATIN) 100000 UNIT/ML suspension Take 5 mLs (500,000 Units total) by mouth 4 (four) times daily. 100 mL 0  . Probiotic Product (PROBIOTIC-10) CAPS Take 1 capsule by mouth daily.    Marland Kitchen ACCU-CHEK SOFTCLIX LANCETS lancets Use as instructed to test blood sugar 2 times daily E11.9 200 each 2   No current facility-administered medications on file prior to visit.     BP 128/84 (BP Location: Right Arm, Patient Position: Sitting, Cuff Size: Normal)   Pulse 87   Temp 98 F (36.7 C) (Oral)   Wt 174 lb 12 oz (79.3 kg)   LMP 07/03/2018   SpO2 98%   BMI 29.08 kg/m    Objective:   Physical Exam  Constitutional: She appears well-nourished.  Cardiovascular: Normal rate.  Respiratory: Effort normal.  Skin: Skin is warm and dry. There is erythema.  Mild erythema to lateral border of left great toe nail.  Mild tenderness upon palpation.  No drainage.           Assessment & Plan:

## 2018-07-14 DIAGNOSIS — L600 Ingrowing nail: Secondary | ICD-10-CM

## 2018-07-14 MED ORDER — MUPIROCIN 2 % EX OINT: TOPICAL_OINTMENT | CUTANEOUS | 0 refills | Status: AC

## 2018-07-17 ENCOUNTER — Ambulatory Visit: Payer: 59 | Admitting: Podiatry

## 2018-07-17 ENCOUNTER — Emergency Department: Payer: 59

## 2018-07-17 ENCOUNTER — Emergency Department
Admission: EM | Admit: 2018-07-17 | Discharge: 2018-07-17 | Disposition: A | Discharge status: Home / Self Care | Payer: 59 | Attending: Emergency Medicine | Admitting: Emergency Medicine

## 2018-07-17 ENCOUNTER — Encounter: Payer: Self-pay | Admitting: Emergency Medicine

## 2018-07-17 ENCOUNTER — Telehealth: Payer: Self-pay

## 2018-07-17 ENCOUNTER — Other Ambulatory Visit: Payer: Self-pay

## 2018-07-17 DIAGNOSIS — I10 Essential (primary) hypertension: Secondary | ICD-10-CM | POA: Diagnosis not present

## 2018-07-17 DIAGNOSIS — E119 Type 2 diabetes mellitus without complications: Secondary | ICD-10-CM | POA: Diagnosis not present

## 2018-07-17 DIAGNOSIS — N939 Abnormal uterine and vaginal bleeding, unspecified: Secondary | ICD-10-CM | POA: Diagnosis not present

## 2018-07-17 DIAGNOSIS — Z79899 Other long term (current) drug therapy: Secondary | ICD-10-CM | POA: Diagnosis not present

## 2018-07-17 LAB — COMPREHENSIVE METABOLIC PANEL
ALT: 62 U/L — ABNORMAL HIGH (ref 0–44)
AST: 46 U/L — ABNORMAL HIGH (ref 15–41)
Albumin: 4.8 g/dL (ref 3.5–5.0)
Alkaline Phosphatase: 73 U/L (ref 38–126)
Anion gap: 8 (ref 5–15)
BILIRUBIN TOTAL: 1.3 mg/dL — AB (ref 0.3–1.2)
BUN: 14 mg/dL (ref 6–20)
CO2: 23 mmol/L (ref 22–32)
Calcium: 8.9 mg/dL (ref 8.9–10.3)
Chloride: 105 mmol/L (ref 98–111)
Creatinine, Ser: 0.8 mg/dL (ref 0.44–1.00)
GFR calc Af Amer: >60 mL/min (ref >60)
GFR calc non Af Amer: >60 mL/min (ref >60)
GLUCOSE: 203 mg/dL — AB (ref 70–99)
POTASSIUM: 4.6 mmol/L (ref 3.5–5.1)
Sodium: 136 mmol/L (ref 135–145)
Total Protein: 8.8 g/dL — ABNORMAL HIGH (ref 6.5–8.1)

## 2018-07-17 LAB — CBC WITH DIFFERENTIAL/PLATELET
Abs Immature Granulocytes: 0.03 10*3/uL (ref 0.00–0.07)
Basophils Absolute: 0 10*3/uL (ref 0.0–0.1)
Basophils Relative: 1 %
EOS ABS: 0.5 10*3/uL (ref 0.0–0.5)
Eosinophils Relative: 6 %
HCT: 43 % (ref 36.0–46.0)
Hemoglobin: 13.6 g/dL (ref 12.0–15.0)
IMMATURE GRANULOCYTES: 0 %
Lymphocytes Relative: 28 %
Lymphs Abs: 2.1 10*3/uL (ref 0.7–4.0)
MCH: 27.8 pg (ref 26.0–34.0)
MCHC: 31.6 g/dL (ref 30.0–36.0)
MCV: 87.9 fL (ref 80.0–100.0)
Monocytes Absolute: 0.5 10*3/uL (ref 0.1–1.0)
Monocytes Relative: 6 %
Neutro Abs: 4.4 10*3/uL (ref 1.7–7.7)
Neutrophils Relative %: 59 %
Platelets: 307 10*3/uL (ref 150–400)
RBC: 4.89 MIL/uL (ref 3.87–5.11)
RDW: 15.3 % (ref 11.5–15.5)
WBC: 7.5 10*3/uL (ref 4.0–10.5)
nRBC: 0 % (ref 0.0–0.2)

## 2018-07-17 LAB — POC URINE PREG, ED: Preg Test, Ur: NEGATIVE

## 2018-07-17 MED ORDER — SODIUM CHLORIDE 0.9 % IV BOLUS
1000.0000 mL | Freq: Once | INTRAVENOUS | Status: AC
  Administered 2018-07-17: 1000 mL via INTRAVENOUS

## 2018-07-17 MED ORDER — SODIUM CHLORIDE 0.9 % IV BOLUS
1000.0000 mL | Freq: Once | INTRAVENOUS | Status: DC
  Administered 2018-07-17: 1000 mL via INTRAVENOUS

## 2018-07-17 NOTE — Telephone Encounter (Signed)
Noted and reviewed chart.

## 2018-07-17 NOTE — ED Triage Notes (Signed)
Patient reports that she is 12 weeks postpartum. States she had her first normal period from 1/6-1/14. Reports on Saturday, she started having heaving bleeding with clots. Reports changing pad every 1-2 hours. Patient reports today she started having some dizziness and abdominal cramping.

## 2018-07-17 NOTE — ED Triage Notes (Signed)
First Nurse Note:  C/O vaginal bleeding.  Initially bleeding was more spotting on Thursday and then vaginal bleeding worsened on Saturday.  LMP:  07/03/2018.  Patient states she is feeling dizzy and faint.  Wheelchair given.   Patient delivered via C-Section 04/24/2018.

## 2018-07-17 NOTE — Telephone Encounter (Signed)
[facility] Night - Client TELEPHONE ADVICE RECORD [facility] Gender: Female DOB: 11-20-1980 Age: 38 Y 5 M 23 D Return Phone Number: [phone] (Primary) Address: [place]Zip[place] [id number] Client [facility] - Client Client Site [facility] - Night Physician Vernona Riegerlark, [name] Call Type Triage / Clinical Relationship To Patient Self Return Phone Number [phone] (Primary) Chief Complaint Vaginal Bleeding Reason for Call Symptomatic / Request for Health Information Initial Comment Caller states 12 wks post partum; period came on 6th; finished Tuesday; spotting Thurs; nothing Fri; then Saturday heavily bleeding began and still heavily bleeding; changed pad 1x in an hour; csection; Translation No Nurse Assessment Nurse: Lorin PicketScott, RN, Elnita Maxwellheryl Date/Time (Eastern Time): 07/16/2018 12:39:20 PM Confirm and document reason for call. If symptomatic, describe symptoms. ---Caller states she is 12 weeks postpartum. On 07/03/2018 (Monday) she had her period and it stopped on Tuesday 07/11/2018, Then on Thursday 07/13/18 she had spotting, and is bleeding heavily since Friday. Was changing pad every hour, and now is changing pad every 2 hours. Denies pain and other symptoms. BP 136/90, P-109, Drinking fluids and voiding. Does the patient have any new or worsening symptoms? ---Yes Will a triage be completed? ---Yes Related visit to physician within the last 2 weeks? ---Yes Does the PT have any chronic conditions? (i.e. diabetes, asthma, this includes High risk factors for pregnancy, etc.) ---Yes List chronic conditions. ---Diabetes HTN Is the patient pregnant or possibly pregnant? (Ask all females between the ages of 4012-55) ---No Is this a behavioral health or substance abuse call?  ---No Guidelines Guideline Title Affirmed Question Affirmed Notes Nurse Date/Time (Eastern Time) Vaginal Bleeding - Abnormal [1] Constant abdominal pain AND [2] present > 2 hours Lorin PicketScott, RN, Elnita MaxwellCheryl 07/16/2018 12:45:12 PM Disp. Time Lamount Cohen(Eastern Time) Disposition Final User PLEASE NOTE: All timestamps contained within this report are represented as [place] Standard Time. CONFIDENTIALTY NOTICE: This fax transmission is intended only for the addressee. It contains information that is legally privileged, confidential or otherwise protected from use or disclosure. If you are not the intended recipient, you are strictly prohibited from reviewing, disclosing, copying using or disseminating any of this information or taking any action in reliance on or regarding this information. If you have received this fax in error, please notify us immediately by telephone so that we can arrange for its return to us. Phone: [phone], Toll-Free: [phone], Fax: [phone] Page: 2 of 2 Call Id: [id number] 07/16/2018 12:48:18 PM See HCP within 4 Hours (or PCP triage) Yes Lorin PicketScott, RN, Elizabeth Sauerheryl Caller Disagree/Comply Comply Caller Understands Yes PreDisposition Go to Urgent Care/Walk-In Clinic Care Advice Given Per Guideline SEE HCP WITHIN 4 HOURS (OR PCP TRIAGE): * IF OFFICE WILL BE OPEN: You need to be seen within the next 3 or 4 hours. Call your doctor (or NP/PA) now or as soon as the office opens. CALL BACK IF: * You become worse. CARE ADVICE given per Vaginal Bleeding, Abnormal (Adult) guideline. Referrals GO TO FACILITY UNDECIDED

## 2018-07-17 NOTE — Discharge Instructions (Addendum)
Please seek medical attention for any high fevers, chest pain, shortness of breath, change in behavior, persistent vomiting, bloody stool or any other new or concerning symptoms.  

## 2018-07-17 NOTE — ED Provider Notes (Signed)
[facility] Emergency Department Provider Note  ____________________________________________   I have reviewed the triage vital signs and the nursing notes.   HISTORY  Chief Complaint Vaginal Bleeding   History limited by: Not Limited   HPI Rachel Rice is a 38 y.o. female who presents to the emergency department today because of concerns for vaginal bleeding.  The patient states she is roughly 12 weeks postpartum.  She states that she did have a period earlier this month but then noticed a few days ago some spotting.  For the past 2 days however the bleeding has been heavier.  This has been accompanied by some lower abdominal cramping.  Patient states that she did start feeling some shortness of breath and dizziness today.  Denies any history of any bleeding disorders.  Denies being on any blood thinning medication.  Per medical record review patient has a history of GERD, HTN  Past Medical History:  Diagnosis Date  . Advanced maternal age, 1st pregnancy, second trimester   . Diabetes mellitus without complication ([place])   . GERD (gastroesophageal reflux disease)   . Hypertension   . Preeclampsia in postpartum period   . Preeclampsia in postpartum period     Patient Active Problem List   Diagnosis Date Noted  . Ingrown toenail 07/12/2018  . Right flank pain 06/30/2018  . Flank pain 06/13/2018  . Recurrent UTI 05/31/2018  . Hemorrhoids 05/24/2018  . Liver lesion, left lobe 05/18/2018  . Type 2 diabetes mellitus without complication, without long-term current use of insulin ([place]) 08/31/2017  . Essential hypertension 08/31/2017    Past Surgical History:  Procedure Laterality Date  . CESAREAN SECTION    . TYMPANOSTOMY TUBE PLACEMENT      Prior to Admission medications   Medication Sig Start Date End Date Taking? Authorizing Provider  ACCU-CHEK SOFTCLIX LANCETS lancets Use as instructed to test blood sugar 2 times daily E11.9 06/05/18   Pleas Koch, NP  aspirin EC 81 MG tablet Take 81 mg by mouth daily.    [provider]  Blood Glucose Monitoring Suppl (ACCU-CHEK AVIVA PLUS) w/Device KIT Use as instructed to test blood sugar daily 09/13/17   Pleas Koch, NP  fluconazole (DIFLUCAN) 100 MG tablet Take 2 tablets on day 1, then 1 tablet once daily for 6 additional days. 07/04/18   Pleas Koch, NP  glucose blood (ACCU-CHEK AVIVA PLUS) test strip Use as instructed as instructed to test blood sugar 2 times daily 09/13/17   Pleas Koch, NP  hydrocortisone (ANUSOL-HC) 2.5 % rectal cream Place 1 application rectally 2 (two) times daily. 05/19/18   Pleas Koch, NP  lisinopril (PRINIVIL,ZESTRIL) 10 MG tablet Take 1 tablet (10 mg total) by mouth daily. For blood pressure. 05/18/18   Pleas Koch, NP  Metformin HCl 500 MG/5ML SOLN Take 500 mg (5 ml) every morning and 1000 mg (10 ml) every evening for diabetes. 07/12/18   Pleas Koch, NP  Multiple Vitamin (MULTIVITAMIN) capsule Take 1 capsule by mouth daily.    [provider]  mupirocin ointment (BACTROBAN) 2 % Apply topically twice daily. 07/14/18   Pleas Koch, NP  norethindrone-ethinyl estradiol (JUNEL FE,GILDESS FE,LOESTRIN FE) 1-20 MG-MCG tablet Take 1 tablet by mouth daily.    [provider]  nystatin (MYCOSTATIN) 100000 UNIT/ML suspension Take 5 mLs (500,000 Units total) by mouth 4 (four) times daily. 06/26/18   Jearld Fenton, NP  Probiotic Product (PROBIOTIC-10) CAPS Take 1 capsule  by mouth daily.    [provider]    Allergies Fluticasone; Iodinated diagnostic agents; Gadolinium derivatives; and Latex  Family History  Problem Relation Age of Onset  . Ovarian cancer Paternal Grandmother 58  . Hypertension Mother   . Hypertension Father   . Diabetes Father        secondary to agent orange exposure  . Hypertension Brother   . Heart disease Paternal Grandfather   . Hypertension Brother   . Breast  cancer Neg Hx     Social History Social History   Tobacco Use  . Smoking status: Never Smoker  . Smokeless tobacco: Never Used  Substance Use Topics  . Alcohol use: Not Currently    Comment: rare  . Drug use: No    Review of Systems Constitutional: No fever/chills Eyes: No visual changes. ENT: No sore throat. Cardiovascular: Denies chest pain. Respiratory: Positive for shortness of breath.  Gastrointestinal: Positive for lower abdominal pain. Genitourinary: Positive for vaginal bleeding.  Musculoskeletal: Negative for back pain. Skin: Negative for rash. Neurological: Negative for headaches, focal weakness or numbness.  ____________________________________________   PHYSICAL EXAM:  VITAL SIGNS: ED Triage Vitals  Enc Vitals Group     BP 07/17/18 1007 128/85     Pulse Rate 07/17/18 1007 (!) 106     Resp 07/17/18 1007 16     Temp 07/17/18 1007 98.2 F (36.8 C)     Temp Source 07/17/18 1007 Oral     SpO2 07/17/18 1007 97 %     Weight 07/17/18 1008 170 lb (77.1 kg)     Height 07/17/18 1008 5' 5" (1.651 m)     Head Circumference --      Peak Flow --      Pain Score 07/17/18 1007 5   Constitutional: Alert and oriented.  Eyes: Conjunctivae are normal.  ENT      Head: Normocephalic and atraumatic.      Nose: No congestion/rhinnorhea.      Mouth/Throat: Mucous membranes are moist.      Neck: No stridor. Cardiovascular: Normal rate, regular rhythm.  No murmurs, rubs, or gallops.  Respiratory: Normal respiratory effort without tachypnea nor retractions. Breath sounds are clear and equal bilaterally. No wheezes/rales/rhonchi. Gastrointestinal: Soft and non tender. No rebound. No guarding.  Genitourinary: Deferred Musculoskeletal: Normal range of motion in all extremities.  Neurologic:  Normal speech and language. No gross focal neurologic deficits are appreciated.  Skin:  Skin is warm, dry and intact. No rash noted. Psychiatric: Mood and affect are normal. Speech and  behavior are normal. Patient exhibits appropriate insight and judgment.  ____________________________________________    LABS (pertinent positives/negatives)  CBC wbc 7.5, hgb 13.6, plt 307 CMP wnl except glu 203, t pro 8.8, ast 46, alt 62, t bili 1.3  ____________________________________________   EKG  None  ____________________________________________    RADIOLOGY  [place] Some fluid in cervical canal. No other abnormal findings  ____________________________________________   PROCEDURES  Procedures  ____________________________________________   INITIAL IMPRESSION / ASSESSMENT AND PLAN / ED COURSE  Pertinent labs & imaging results that were available during my care of the patient were reviewed by me and considered in my medical decision making (see chart for details).   Patient presented to the emergency department today because of concerns for heavy vaginal bleeding.  Patient's blood work here showed a normal hemoglobin.  Vital signs without hypotension or concerning tachycardia.  Ultrasound was performed which did show some fluid in the cervical canal otherwise without  any concerning findings.  At this point I think likely that represents blood.  Did offer to start patient on birth control medication to try to stop the bleeding however she felt comfortable deferring at this time and will plan on following up with her OB/GYN doctor in the next couple of days.  ____________________________________________   FINAL CLINICAL IMPRESSION(S) / ED DIAGNOSES  Final diagnoses:  Abnormal vaginal bleeding     Note: This dictation was prepared with Dragon dictation. Any transcriptional errors that result from this process are unintentional     Nance Pear, MD 07/17/18 1455

## 2018-07-17 NOTE — Telephone Encounter (Signed)
Per chart review tab pt is presently at [facility] ED. 

## 2018-07-17 NOTE — ED Notes (Signed)
Patient transported to Ultrasound 

## 2018-07-18 ENCOUNTER — Telehealth: Payer: Self-pay | Admitting: *Deleted

## 2018-07-18 NOTE — Telephone Encounter (Signed)
Called and spoke to patient about an ED follow-up appointment with Mayra ReelKate Clark NP. Patient stated that she was told to follow-up with her OB/GYN and has an appointment scheduled at [facility] Monday 07/24/18. Patient stated that she does not know why she would need to come in to see Jae DireKate since she is seeing her OB/GYN. Advised patient after speaking to Jae DireKate she does not need to schedule a follow-up with Jae DireKate unless she needs something else. Patient stated that she will go for her appointment with OB/GYN and if she feels that she needs to see Jae DireKate after that she will call the office.

## 2018-07-18 NOTE — Telephone Encounter (Signed)
Noted and agree. 

## 2018-07-20 ENCOUNTER — Encounter: Payer: Self-pay | Admitting: Podiatry

## 2018-07-20 ENCOUNTER — Ambulatory Visit (INDEPENDENT_AMBULATORY_CARE_PROVIDER_SITE_OTHER): Payer: 59 | Admitting: Podiatry

## 2018-07-20 VITALS — BP 122/89 | HR 99 | Resp 16

## 2018-07-20 DIAGNOSIS — L600 Ingrowing nail: Secondary | ICD-10-CM | POA: Diagnosis not present

## 2018-07-20 MED ORDER — NEOMYCIN-POLYMYXIN-HC 3.5-10000-1 OT SOLN: OTIC | 1 refills | Status: AC

## 2018-07-20 NOTE — Progress Notes (Signed)
Subjective:   Patient ID: Rachel Rice, female   DOB: 38 y.o.   MRN: [id number]   HPI 38 year old patient presents with chronic ingrown toenail of the left big toe.  States that it is been present for a while and is worsened over the last week and she had her other toe fixed several years ago.  Patient has diabetes under good control with her last A1c being 6.3 and patient does not smoke and likes to be active   Review of Systems  All other systems reviewed and are negative.       Objective:  Physical Exam Vitals signs and nursing note reviewed.  Constitutional:      Appearance: She is well-developed.  Pulmonary:     Effort: Pulmonary effort is normal.  Musculoskeletal: Normal range of motion.  Skin:    General: Skin is warm.  Neurological:     Mental Status: She is alert.     Neurovascular status found to be intact muscle strength was adequate range of motion within normal limits with patient found to have incurvated medial border left hallux that painful with pressed with slight redness but no drainage noted.  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  Patient signed consent form after review and today I infiltrated the left hallux 60 mg Xylocaine Marcaine mixture sterile prep applied and using sterile instrumentation remove the medial border exposing matrix and applied phenol 3 applications 30 seconds followed by alcohol lavage and sterile dressing.  Gave instructions on soaks and to leave dressing on 24 hours but to take it off earlier if throbbing were to occur.  Wrote prescription for antibiotic drops and encouraged to call with any questions

## 2018-07-20 NOTE — Patient Instructions (Signed)

## 2018-07-24 ENCOUNTER — Encounter: Payer: Self-pay | Admitting: Podiatry

## 2018-07-27 ENCOUNTER — Encounter: Payer: Self-pay | Admitting: Podiatry

## 2018-07-28 NOTE — Telephone Encounter (Addendum)
I called pt and apologized for her not receiving a call. I told pt that the current picture of her procedure toe looks about the way it should, to continue the soaks 1/4 cup epsom salt in 1 quart warm water twice a day with antibiotic drops for one more week, then daily with drops and allow to air dry when resting. Pt states the base is purplish and I told her it was due bruising.

## 2018-08-04 ENCOUNTER — Encounter: Payer: Self-pay | Admitting: Podiatry

## 2018-08-09 ENCOUNTER — Ambulatory Visit (INDEPENDENT_AMBULATORY_CARE_PROVIDER_SITE_OTHER): Payer: 59

## 2018-08-09 DIAGNOSIS — L600 Ingrowing nail: Secondary | ICD-10-CM

## 2018-08-09 NOTE — Progress Notes (Signed)
Patient is here today for follow-up appointment, recent procedure performed on 07/20/2018, removal of ingrown hallux nail.  She states that overall the area is not tender, and is feeling much better than before the surgery.  No redness, no erythema, no swelling, no drainage, no other signs or symptoms of infection.  The area is scabbed over and healing well at this time.  Discussed signs and symptoms of infection with patient, verbal and written instructions were given.  She is to follow-up as needed with any acute symptom changes.

## 2018-08-09 NOTE — Patient Instructions (Signed)

## 2018-08-16 ENCOUNTER — Encounter: Payer: Self-pay | Admitting: Podiatry

## 2018-08-28 DIAGNOSIS — L918 Other hypertrophic disorders of the skin: Secondary | ICD-10-CM

## 2018-09-15 ENCOUNTER — Ambulatory Visit: Payer: 59 | Admitting: Primary Care

## 2018-09-18 ENCOUNTER — Telehealth: Payer: 59 | Admitting: Physician Assistant

## 2018-09-18 DIAGNOSIS — R059 Cough, unspecified: Secondary | ICD-10-CM

## 2018-09-18 DIAGNOSIS — R0982 Postnasal drip: Secondary | ICD-10-CM

## 2018-09-18 DIAGNOSIS — R05 Cough: Secondary | ICD-10-CM

## 2018-09-18 MED ORDER — BENZONATATE 100 MG PO CAPS: 100.0000 mg | ORAL_CAPSULE | Freq: Three times a day (TID) | ORAL | 0 refills | Status: DC | PRN

## 2018-09-18 NOTE — Progress Notes (Signed)
We are sorry that you are not feeling well.  Here is how we plan to help!  Your symptoms are not consistent with a coronavirus infection which is great news giving everything going on currently. I have reviewed your questionnaire and your chart and I do agree with your primary care provider that symptoms seem to be stemming from uncontrolled allergies. See care plan below.  Based on your presentation I believe you most likely have A cough due to allergies.  I recommend that you start the an over-the counter-allergy medication such as Xyzal (insteado of the Claritin) Continue the Nasacort as directed by your primary care provider or you can switch to over-the-counter Flonase.   In addition you may use A prescription cough medication called Tessalon Perles 100mg . You may take 1-2 capsules every 8 hours as needed for your cough. I have sent in the prescription. If not improving over the next 72 hours or so, please let us know and we can consider the addition of an antibiotic. Again this really seems to all be from allergic inflammation.   From your responses in the eVisit questionnaire you describe inflammation in the upper respiratory tract which is causing a significant cough.  This is commonly called Bronchitis and has four common causes:    Allergies  Viral Infections  Acid Reflux  Bacterial Infection Allergies, viruses and acid reflux are treated by controlling symptoms or eliminating the cause. An example might be a cough caused by taking certain blood pressure medications. You stop the cough by changing the medication. Another example might be a cough caused by acid reflux. Controlling the reflux helps control the cough.  USE OF BRONCHODILATOR ("RESCUE") INHALERS: There is a risk from using your bronchodilator too frequently.  The risk is that over-reliance on a medication which only relaxes the muscles surrounding the breathing tubes can reduce the effectiveness of medications prescribed to  reduce swelling and congestion of the tubes themselves.  Although you feel brief relief from the bronchodilator inhaler, your asthma may actually be worsening with the tubes becoming more swollen and filled with mucus.  This can delay other crucial treatments, such as oral steroid medications. If you need to use a bronchodilator inhaler daily, several times per day, you should discuss this with your provider.  There are probably better treatments that could be used to keep your asthma under control.     HOME CARE . Only take medications as instructed by your medical team. . Complete the entire course of an antibiotic. . Drink plenty of fluids and get plenty of rest. . Avoid close contacts especially the very young and the elderly . Cover your mouth if you cough or cough into your sleeve. . Always remember to wash your hands . A steam or ultrasonic humidifier can help congestion.   GET HELP RIGHT AWAY IF: . You develop worsening fever. . You become short of breath . You cough up blood. . Your symptoms persist after you have completed your treatment plan MAKE SURE YOU   Understand these instructions.  Will watch your condition.  Will get help right away if you are not doing well or get worse.  Your e-visit answers were reviewed by a board certified advanced clinical practitioner to complete your personal care plan.  Depending on the condition, your plan could have included both over the counter or prescription medications. If there is a problem please reply  once you have received a response from your provider. Your safety is important  to [place].  If you have drug allergies check your prescription carefully.    You can use MyChart to ask questions about today's visit, request a non-urgent call back, or ask for a work or school excuse for 24 hours related to this e-Visit. If it has been greater than 24 hours you will need to follow up with your provider, or enter a new e-Visit to address those  concerns. You will get an e-mail in the next two days asking about your experience.  I hope that your e-visit has been valuable and will speed your recovery. Thank you for using e-visits.

## 2018-09-18 NOTE — Progress Notes (Signed)
I have spent 5 minutes in review of e-visit questionnaire, review and updating patient chart, medical decision making and response to patient.   Marciano Mundt Cody Stephaney Steven, PA-C    

## 2018-09-20 ENCOUNTER — Other Ambulatory Visit: Payer: Self-pay | Admitting: Physician Assistant

## 2018-09-24 ENCOUNTER — Telehealth: Payer: 59 | Admitting: Family

## 2018-09-24 DIAGNOSIS — J019 Acute sinusitis, unspecified: Principal | ICD-10-CM

## 2018-09-24 DIAGNOSIS — B9689 Other specified bacterial agents as the cause of diseases classified elsewhere: Secondary | ICD-10-CM

## 2018-09-24 MED ORDER — AMOXICILLIN-POT CLAVULANATE 875-125 MG PO TABS: 1.0000 | ORAL_TABLET | Freq: Two times a day (BID) | ORAL | 0 refills | Status: DC

## 2018-09-24 NOTE — Progress Notes (Signed)

## 2018-09-25 ENCOUNTER — Telehealth: Payer: 59 | Admitting: Nurse Practitioner

## 2018-09-25 DIAGNOSIS — J00 Acute nasopharyngitis [common cold]: Secondary | ICD-10-CM

## 2018-09-25 NOTE — Progress Notes (Signed)
E-Visit for Corona Virus Screening  Based on your current symptoms, it seems unlikely that your symptoms are related to the Coronavirus.   Coronavirus disease 2019 (COVID-19) is a respiratory illness that can spread from person to person. The virus that causes COVID-19 is a new virus that was first identified in the country of [place] but is now found in multiple other countries and has spread to the [place].  Symptoms associated with the virus are mild to severe fever, cough, and shortness of breath. There is currently no vaccine to protect against COVID-19, and there is no specific antiviral treatment for the virus.   To be considered HIGH RISK for Coronavirus (COVID-19), you have to meet the following criteria:  . Traveled to [place], [place], [place], [place] or [place]; or in the [place] to [place], [place], [place], or [place]; and have fever, cough, and shortness of breath within the last 2 weeks of travel OR  . Been in close contact with a person diagnosed with COVID-19 within the last 2 weeks and have fever, cough, and shortness of breath  . IF YOU DO NOT MEET THESE CRITERIA, YOU ARE CONSIDERED LOW RISK FOR COVID-19.   It is vitally important that if you feel that you have an infection such as this virus or any other virus that you stay home and away from places where you may spread it to others.  You should self-quarantine for 14 days if you have symptoms that could potentially be coronavirus and avoid contact with people age 95 and older.   You can use medication such as delsym or mucinx OTC for cough  You may also take acetaminophen (Tylenol) as needed for fever.   Reduce your risk of any infection by using the same precautions used for avoiding the common cold or flu:  Marland Kitchen Wash your hands often with soap and warm water for at least 20 seconds.  If soap and water are not readily available, use an alcohol-based hand sanitizer with at least 60% alcohol.  . If coughing or  sneezing, cover your mouth and nose by coughing or sneezing into the elbow areas of your shirt or coat, into a tissue or into your sleeve (not your hands). . Avoid shaking hands with others and consider head nods or verbal greetings only. . Avoid touching your eyes, nose, or mouth with unwashed hands.  . Avoid close contact with people who are sick. . Avoid places or events with large numbers of people in one location, like concerts or sporting events. . Carefully consider travel plans you have or are making. . If you are planning any travel outside or inside the [place], visit the CDC's Travelers' Health webpage for the latest health notices. . If you have some symptoms but not all symptoms, continue to monitor at home and seek medical attention if your symptoms worsen. . If you are having a medical emergency, call 911.  HOME CARE . Only take medications as instructed by your medical team. . Drink plenty of fluids and get plenty of rest. . A steam or ultrasonic humidifier can help if you have congestion.   GET HELP RIGHT AWAY IF: . You develop worsening fever. . You become short of breath . You cough up blood. . Your symptoms become more severe MAKE SURE YOU   Understand these instructions.  Will watch your condition.  Will get help right away if you are not doing well or get worse.  Your e-visit answers  were reviewed by a board certified advanced clinical practitioner to complete your personal care plan.  Depending on the condition, your plan could have included both over the counter or prescription medications.  If there is a problem please reply once you have received a response from your provider. Your safety is important to us.  If you have drug allergies check your prescription carefully.    You can use MyChart to ask questions about today's visit, request a non-urgent call back, or ask for a work or school excuse for 24 hours related to this e-Visit. If it has been greater than 24  hours you will need to follow up with your provider, or enter a new e-Visit to address those concerns. You will get an e-mail in the next two days asking about your experience.  I hope that your e-visit has been valuable and will speed your recovery. Thank you for using e-visits.   5 minutes spent reviewing and documenting in chart.  

## 2018-10-03 DIAGNOSIS — T3695XA Adverse effect of unspecified systemic antibiotic, initial encounter: Principal | ICD-10-CM

## 2018-10-03 DIAGNOSIS — B379 Candidiasis, unspecified: Secondary | ICD-10-CM

## 2018-10-03 MED ORDER — FLUCONAZOLE 150 MG PO TABS: 150.0000 mg | ORAL_TABLET | Freq: Once | ORAL | 0 refills | Status: AC

## 2018-10-06 ENCOUNTER — Other Ambulatory Visit: Payer: Self-pay | Admitting: Primary Care

## 2018-10-06 DIAGNOSIS — I10 Essential (primary) hypertension: Secondary | ICD-10-CM

## 2018-10-13 ENCOUNTER — Ambulatory Visit (INDEPENDENT_AMBULATORY_CARE_PROVIDER_SITE_OTHER): Payer: 59 | Admitting: Primary Care

## 2018-10-13 ENCOUNTER — Encounter: Payer: Self-pay | Admitting: Primary Care

## 2018-10-13 ENCOUNTER — Other Ambulatory Visit: Payer: Self-pay

## 2018-10-13 VITALS — BP 122/86 | HR 102 | Temp 98.0°F | Ht 65.0 in | Wt 168.2 lb

## 2018-10-13 DIAGNOSIS — J302 Other seasonal allergic rhinitis: Secondary | ICD-10-CM | POA: Insufficient documentation

## 2018-10-13 MED ORDER — MONTELUKAST SODIUM 10 MG PO TABS: 10.0000 mg | ORAL_TABLET | Freq: Every day | ORAL | 0 refills | Status: DC

## 2018-10-13 NOTE — Telephone Encounter (Signed)
Spoken to patient and schedule appointment

## 2018-10-13 NOTE — Assessment & Plan Note (Signed)
Symptoms representative of allergy involvement. No evidence of anaphylaxis or respiratory distress given complaints of throat tightness.   Given that she's failed numerous OTC regimens for allergies, we will proceed with Singulair nightly. Continue Benadryl for throat tightness as needed. She will update.

## 2018-10-13 NOTE — Telephone Encounter (Signed)
In office visit please. Will you respond to patient?

## 2018-10-13 NOTE — Patient Instructions (Signed)
Start montelukast (Singulair) every evening at bedtime for allergies.   Continue Benadryl for throat symptoms.   You can add back in Xyzal in the morning for allergies if needed.  Please update me if no improvement.  It was a pleasure to see you today!

## 2018-10-13 NOTE — Progress Notes (Signed)
Subjective:    Patient ID: Rachel Rice, female    DOB: 1980-07-07, 38 y.o.   MRN: [id number]  HPI  [name] is a 38 year old female with a history of type 2 diabetes, seasonal allergies, oral thrush, hypertension who presents today with a chief complaint of throat swelling.  She sent a message via My Chart on 10/03/18 with reports of oral thrush so she was treated with fluconazole. Today she endorses that she took both doses three days apart due to no resolve and is doing better.  Symptoms include sore throat, swelling/tightness, post nasal drip. She has chronic allergies that have been bothersome for the last one month. She's been taking Xyzal, liquid Benadryl, Nasacort nasal spray recently, and the Benadryl is helping with throat swelling. She's tried numerous other OTC treatments for her chronic allergies but nothing has helped.  She denies loss in sense of taste, fevers, cough, shortness of breath, wheezing, difficulty swallowing.     Review of Systems  Constitutional: Negative for chills and fever.  HENT: Positive for postnasal drip, sinus pressure and sore throat. Negative for congestion and ear pain.   Respiratory: Negative for cough and shortness of breath.   Allergic/Immunologic: Positive for environmental allergies.       Past Medical History:  Diagnosis Date  . Advanced maternal age, 1st pregnancy, second trimester   . Diabetes mellitus without complication ([place])   . GERD (gastroesophageal reflux disease)   . Hypertension   . Preeclampsia in postpartum period   . Preeclampsia in postpartum period      Social History   Socioeconomic History  . Marital status: Married    Spouse name: Not on file  . Number of children: Not on file  . Years of education: Not on file  . Highest education level: Not on file  Occupational History  . Not on file  Social Needs  . Financial resource strain: Not on file  . Food insecurity:    Worry: Not on file    Inability: Not on  file  . Transportation needs:    Medical: Not on file    Non-medical: Not on file  Tobacco Use  . Smoking status: Never Smoker  . Smokeless tobacco: Never Used  Substance and Sexual Activity  . Alcohol use: Not Currently    Comment: rare  . Drug use: No  . Sexual activity: Yes  Lifestyle  . Physical activity:    Days per week: Not on file    Minutes per session: Not on file  . Stress: Not on file  Relationships  . Social connections:    Talks on phone: Not on file    Gets together: Not on file    Attends religious service: Not on file    Active member of club or organization: Not on file    Attends meetings of clubs or organizations: Not on file    Relationship status: Not on file  . Intimate partner violence:    Fear of current or ex partner: Not on file    Emotionally abused: Not on file    Physically abused: Not on file    Forced sexual activity: Not on file  Other Topics Concern  . Not on file  Social History Narrative   Married.    No Children.   Claims adjuster.    Past Surgical History:  Procedure Laterality Date  . CESAREAN SECTION    . TYMPANOSTOMY TUBE PLACEMENT      Family History  Problem Relation Age of Onset  . Ovarian cancer Paternal Grandmother 49  . Hypertension Mother   . Hypertension Father   . Diabetes Father        secondary to agent orange exposure  . Hypertension Brother   . Heart disease Paternal Grandfather   . Hypertension Brother   . Breast cancer Neg Hx     Allergies  Allergen Reactions  . Fluticasone Other (See Comments)    nosebleed nosebleed  Other reaction(s): Other (See Comments) nosebleed  . Iodinated Diagnostic Agents Hives  . Gadolinium Derivatives     Other reaction(s): Other (See Comments)  . Latex Rash    Current Outpatient Medications on File Prior to Visit  Medication Sig Dispense Refill  . ACCU-CHEK SOFTCLIX LANCETS lancets Use as instructed to test blood sugar 2 times daily E11.9 200 each 2  .  amoxicillin-clavulanate (AUGMENTIN) 875-125 MG tablet Take 1 tablet by mouth 2 (two) times daily. 14 tablet 0  . aspirin EC 81 MG tablet Take 81 mg by mouth daily.    . benzonatate (TESSALON) 200 MG capsule Take 1 capsule (200 mg total) by mouth 3 (three) times daily as needed for cough. 30 capsule 0  . Blood Glucose Monitoring Suppl (ACCU-CHEK AVIVA PLUS) w/Device KIT Use as instructed to test blood sugar daily 1 kit 0  . glucose blood (ACCU-CHEK AVIVA PLUS) test strip Use as instructed as instructed to test blood sugar 2 times daily 100 each 12  . hydrocortisone (ANUSOL-HC) 2.5 % rectal cream Place 1 application rectally 2 (two) times daily. 30 g 0  . lisinopril (PRINIVIL,ZESTRIL) 10 MG tablet Take 1 tablet (10 mg total) by mouth daily. For blood pressure. 30 tablet 0  . Metformin HCl 500 MG/5ML SOLN Take 500 mg (5 ml) every morning and 1000 mg (10 ml) every evening for diabetes. 1350 mL 1  . Multiple Vitamin (MULTIVITAMIN) capsule Take 1 capsule by mouth daily.    . mupirocin ointment (BACTROBAN) 2 % Apply topically twice daily. 22 g 0  . neomycin-polymyxin-hydrocortisone (CORTISPORIN) OTIC solution Apply 1-2 drops to toe after soaking BID 10 mL 1  . norethindrone-ethinyl estradiol (JUNEL FE,GILDESS FE,LOESTRIN FE) 1-20 MG-MCG tablet Take 1 tablet by mouth daily.    Marland Kitchen nystatin (MYCOSTATIN) 100000 UNIT/ML suspension Take 5 mLs (500,000 Units total) by mouth 4 (four) times daily. 100 mL 0  . Probiotic Product (PROBIOTIC-10) CAPS Take 1 capsule by mouth daily.     No current facility-administered medications on file prior to visit.     BP 122/86   Pulse (!) 102   Temp 98 F (36.7 C) (Oral)   Ht '5\' 5"'$  (1.651 m)   Wt 168 lb 4 oz (76.3 kg)   LMP 10/05/2018   SpO2 99%   BMI 28.00 kg/m    Objective:   Physical Exam  Constitutional: She appears well-nourished. She does not appear ill.  HENT:  Right Ear: Tympanic membrane and ear canal normal.  Left Ear: Tympanic membrane and ear canal  normal.  Nose: No mucosal edema. Right sinus exhibits no maxillary sinus tenderness and no frontal sinus tenderness. Left sinus exhibits no maxillary sinus tenderness and no frontal sinus tenderness.  Mouth/Throat: Oropharynx is clear and moist.  Mild evidence of oral thrush to posterior tongue and right oropharynx  Neck: Neck supple.  Cardiovascular: Normal rate and regular rhythm.  Respiratory: Effort normal and breath sounds normal. She has no wheezes.  Skin: Skin is warm and dry.  Assessment & Plan:

## 2018-10-17 DIAGNOSIS — B370 Candidal stomatitis: Secondary | ICD-10-CM

## 2018-10-18 MED ORDER — NYSTATIN 100000 UNIT/ML MT SUSP: 5.0000 mL | Freq: Four times a day (QID) | OROMUCOSAL | 0 refills | Status: DC

## 2018-10-19 ENCOUNTER — Telehealth: Payer: 59 | Admitting: Nurse Practitioner

## 2018-10-19 DIAGNOSIS — R6889 Other general symptoms and signs: Principal | ICD-10-CM

## 2018-10-19 DIAGNOSIS — Z20822 Contact with and (suspected) exposure to covid-19: Secondary | ICD-10-CM

## 2018-10-19 MED ORDER — PROMETHAZINE-DM 6.25-15 MG/5ML PO SYRP: 5.0000 mL | ORAL_SOLUTION | Freq: Four times a day (QID) | ORAL | 0 refills | Status: AC | PRN

## 2018-10-19 NOTE — Progress Notes (Signed)

## 2018-10-23 ENCOUNTER — Encounter: Payer: Self-pay | Admitting: Primary Care

## 2018-10-23 ENCOUNTER — Other Ambulatory Visit: Payer: Self-pay

## 2018-10-23 ENCOUNTER — Ambulatory Visit (INDEPENDENT_AMBULATORY_CARE_PROVIDER_SITE_OTHER): Payer: 59 | Admitting: Primary Care

## 2018-10-23 DIAGNOSIS — R197 Diarrhea, unspecified: Secondary | ICD-10-CM | POA: Diagnosis not present

## 2018-10-23 DIAGNOSIS — R110 Nausea: Secondary | ICD-10-CM | POA: Diagnosis not present

## 2018-10-23 MED ORDER — ONDANSETRON 4 MG PO TBDP: 4.0000 mg | ORAL_TABLET | Freq: Three times a day (TID) | ORAL | 0 refills | Status: AC | PRN

## 2018-10-23 NOTE — Patient Instructions (Signed)
You may take the ondansetron (Zofran) every 8 hours as needed for nausea.  Continue to hydrate with water and low sugar Gatorade or Pedialyte.  Notify me if your diarrhea returns, your fevers are consistently at or above 100.8, you feel worse.  It was a pleasure to see you today! Mayra Reel, NP-C

## 2018-10-23 NOTE — Telephone Encounter (Signed)
[facility] - Client TELEPHONE ADVICE RECORD Rachel Rice Patient Name: Rachel Rice Rachel Rice Gender: Female DOB: 24-Jun-1981 Age: 38 Y 8 M 29 D Return Phone Number: [phone] (Primary) Address: City/State/Zip: [place] [id number] Client [facility] - Client Client Site [facility] Physician Rachel Rice, Rachel Rice - NP Contact Type Call Who Is Calling Patient / Member / Family / Caregiver Call Type Triage / Clinical Relationship To Patient Self Return Phone Number [phone] (Primary) Chief Complaint Diarrhea Reason for Call Symptomatic / Request for Health Information Initial Comment Caller states that she has been having diarrhea for the last few days. She is getting dehydrated Translation No Nurse Assessment Nurse: Rachel DawleyMatherly, RN, Rachel Rice ([place])Rachel Rice Date/Time (Eastern Time): 10/21/2018 8:50:15 AM Confirm and document reason for call. If symptomatic, describe symptoms. ---caller states having diarrhea X 3 days. temp of 99.8 oral. had evisit 2 days earlier. DX viral. feels "dehydrated". lost 10 pounds. urinated in last 12 hours. urine is dark. tongue is moist Has the patient had close contact with a person known or suspected to have the novel coronavirus illness OR traveled / lives in area with major community spread (including international travel) in the last 14 days from the onset of symptoms? * If Asymptomatic, screen for exposure and travel within the last 14 days. ---No Does the patient have any new or worsening symptoms? ---Yes Will a triage be completed? ---Yes Related visit to physician within the last 2 weeks? ---Yes Does the PT have any chronic conditions? (i.e. diabetes, asthma, this includes High risk factors for pregnancy, etc.) ---Yes List chronic conditions. ---hypertension. diabetes Is the patient pregnant or possibly pregnant? (Ask all females between the ages of  40-55) ---No Is this a behavioral health or substance abuse call? ---No Guidelines Guideline Title Affirmed Question Affirmed Notes Nurse Date/Time (Eastern Time) Diarrhea [1] MODERATE diarrhea (e.g., 4-6 times / day more than normal) AND [2] Rachel DawleyMatherly, RN, Rachel Rice 10/21/2018 8:56:05 AM PLEASE NOTE: All timestamps contained within this report are represented as [place] Standard Time. CONFIDENTIALTY NOTICE: This fax transmission is intended only for the addressee. It contains information that is legally privileged, confidential or otherwise protected from use or disclosure. If you are not the intended recipient, you are strictly prohibited from reviewing, disclosing, copying using or disseminating any of this information or taking any action in reliance on or regarding this information. If you have received this fax in error, please notify us immediately by telephone so that we can arrange for its return to us. Phone: [phone], Toll-Free: [phone], Fax: [phone] Page: 2 of 2 Call Id: [id number] Guidelines Guideline Title Affirmed Question Affirmed Notes Nurse Date/Time Rachel Rice(Eastern Time) present > 48 hours (2 days) Disp. Time Rachel Rice(Eastern Time) Disposition Final User 10/21/2018 9:02:51 AM See PCP within 24 Hours Yes Rachel DawleyMatherly, RN, Rachel Rice Caller Disagree/Comply Comply Caller Understands Yes PreDisposition Call Doctor Care Advice Given Per Guideline SEE PCP WITHIN 24 HOURS: [facility] MEDICINE - Loperamide (Imodium AD): * This medicine helps decrease diarrhea. It is available over-the-counter (OTC) in a drug store. * Adult dosage: 4 mg (2 capsules) is the recommended first dose. You may take an additional 2 mg (1 capsule) after each loose BM. * Maximum dosage: 16 mg per day (8 capsules). * Do not use for more than 2 days. CALL BACK IF: * Signs of dehydration occur (e.g., no urine over 12 hours, very dry mouth, lightheaded, etc.) * Constant or severe abdominal pain * You become  worse.  CARE ADVICE given per Diarrhea (Adult) guideline. Referrals REFERRED TO PCP OFFICE/

## 2018-10-23 NOTE — Telephone Encounter (Signed)
Pt scheduled [facility] 10/23/18 at 11:20 with Allayne Gitelman NP.

## 2018-10-23 NOTE — Progress Notes (Signed)
Subjective:    Patient ID: Rachel Rice, female    DOB: [birth year], 38 y.o.   MRN: [id number]  HPI  Virtual Visit via Video Note  I connected with Rachel Rice on 10/23/18 at 11:20 AM EDT by a video enabled telemedicine application and verified that I am speaking with the correct person using two identifiers.   I discussed the limitations of evaluation and management by telemedicine and the availability of in person appointments. The patient expressed understanding and agreed to proceed. She is at home, I am in the office.  History of Present Illness:  Ms. Weekes is a 38 year old female with a history of hypertension, type 2 diabetes, recurrent UTI who presents today with a chief complaint of diarrhea.   Her symptoms began with loose stools, abdominal cramping/pain and fatigue five days ago. The following day she woke up and "didn't feel well" and noticed a fever of 99-99.9 and loose stools. Diarrhea began three days ago with two severe episodes back to back. Two mornings ago she experienced another severe episode of diarrhea with nausea. She's been hydrating with water and Pedialyte. She's also taken promethazine-dextromethorphan for nausea. After several episodes of severe diarrhea and a weight loss of 10 pounds in two days she took Imodium.   Her diarrhea subsided Saturday (2 days ago), mostly had nausea yesterday. Temperatures continue to fluctuate between 99.0-99.6. Today she's feeling achy, nauseated. She denies abdominal pain, cough, shortness of breath, vomiting. Her last dose of Imodium was two days ago. This morning she's eaten breakfast and is able to keep food and liquids down. She is needing a work note.   Observations/Objective:  Appears well, but tired. No distress. Alert and oriented.  Assessment and Plan:  Suspect symptoms are from a viral GI infection. She is able to hydrate and is eating. No diarrhea or Imodium for two days which is reassuring. Discussed to notify  me if diarrhea reoccurs, fevers increase to above 100.8 consistently, she feels worse. At that point we will need to complete lab work up and potential imaging.  Follow Up Instructions:  You may take the ondansetron (Zofran) every 8 hours as needed for nausea.  Continue to hydrate with water and low sugar Gatorade or Pedialyte.  Notify me if your diarrhea returns, your fevers are consistently at or above 100.8, you feel worse.  It was a pleasure to see you today! Allie Bossier, NP-C     I discussed the assessment and treatment plan with the patient. The patient was provided an opportunity to ask questions and all were answered. The patient agreed with the plan and demonstrated an understanding of the instructions.   The patient was advised to call back or seek an in-person evaluation if the symptoms worsen or if the condition fails to improve as anticipated.     Pleas Koch, NP    Review of Systems  Constitutional: Positive for fatigue and fever.  Respiratory: Negative for cough and shortness of breath.   Gastrointestinal: Positive for nausea. Negative for abdominal pain, diarrhea and vomiting.       Past Medical History:  Diagnosis Date   Advanced maternal age, 1st pregnancy, second trimester    Diabetes mellitus without complication ([place])    GERD (gastroesophageal reflux disease)    Hypertension    Preeclampsia in postpartum period    Preeclampsia in postpartum period      Social History   Socioeconomic History   Marital status: Married  Spouse name: Not on file   Number of children: Not on file   Years of education: Not on file   Highest education level: Not on file  Occupational History   Not on file  Social Needs   Financial resource strain: Not on file   Food insecurity:    Worry: Not on file    Inability: Not on file   Transportation needs:    Medical: Not on file    Non-medical: Not on file  Tobacco Use   Smoking status: Never  Smoker   Smokeless tobacco: Never Used  Substance and Sexual Activity   Alcohol use: Not Currently    Comment: rare   Drug use: No   Sexual activity: Yes  Lifestyle   Physical activity:    Days per week: Not on file    Minutes per session: Not on file   Stress: Not on file  Relationships   Social connections:    Talks on phone: Not on file    Gets together: Not on file    Attends religious service: Not on file    Active member of club or organization: Not on file    Attends meetings of clubs or organizations: Not on file    Relationship status: Not on file   Intimate partner violence:    Fear of current or ex partner: Not on file    Emotionally abused: Not on file    Physically abused: Not on file    Forced sexual activity: Not on file  Other Topics Concern   Not on file  Social History Narrative   Married.    No Children.   Claims adjuster.    Past Surgical History:  Procedure Laterality Date   CESAREAN SECTION     TYMPANOSTOMY TUBE PLACEMENT      Family History  Problem Relation Age of Onset   Ovarian cancer Paternal Grandmother 34   Hypertension Mother    Hypertension Father    Diabetes Father        secondary to agent orange exposure   Hypertension Brother    Heart disease Paternal Grandfather    Hypertension Brother    Breast cancer Neg Hx     Allergies  Allergen Reactions   Fluticasone Other (See Comments)    nosebleed nosebleed  Other reaction(s): Other (See Comments) nosebleed   Iodinated Diagnostic Agents Hives   Gadolinium Derivatives     Other reaction(s): Other (See Comments)   Latex Rash    Current Outpatient Medications on File Prior to Visit  Medication Sig Dispense Refill   ACCU-CHEK SOFTCLIX LANCETS lancets Use as instructed to test blood sugar 2 times daily E11.9 200 each 2   aspirin EC 81 MG tablet Take 81 mg by mouth daily.     Blood Glucose Monitoring Suppl (ACCU-CHEK AVIVA PLUS) w/Device KIT Use as  instructed to test blood sugar daily 1 kit 0   glucose blood (ACCU-CHEK AVIVA PLUS) test strip Use as instructed as instructed to test blood sugar 2 times daily 100 each 12   hydrocortisone (ANUSOL-HC) 2.5 % rectal cream Place 1 application rectally 2 (two) times daily. 30 g 0   lisinopril (PRINIVIL,ZESTRIL) 10 MG tablet Take 1 tablet (10 mg total) by mouth daily. For blood pressure. 30 tablet 0   Metformin HCl 500 MG/5ML SOLN Take 500 mg (5 ml) every morning and 1000 mg (10 ml) every evening for diabetes. 1350 mL 1   montelukast (SINGULAIR) 10 MG tablet Take 1  10 mg total) by mouth at bedtime. For allergies. 30 tablet 0  °• Multiple Vitamin (MULTIVITAMIN) capsule Take 1 capsule by mouth daily.    °• mupirocin ointment (BACTROBAN) 2 % Apply topically twice daily. 22 g 0  °• neomycin-polymyxin-hydrocortisone (CORTISPORIN) OTIC solution Apply 1-2 drops to toe after soaking BID 10 mL 1  °• norethindrone-ethinyl estradiol (JUNEL FE,GILDESS FE,LOESTRIN FE) 1-20 MG-MCG tablet Take 1 tablet by mouth daily.    °• nystatin (MYCOSTATIN) 100000 UNIT/ML suspension Take 5 mLs (500,000 Units total) by mouth 4 (four) times daily. 100 mL 0  °• Probiotic Product (PROBIOTIC-10) CAPS Take 1 capsule by mouth daily.    °• promethazine-dextromethorphan (PROMETHAZINE-DM) 6.25-15 MG/5ML syrup Take 5 mLs by mouth 4 (four) times daily as needed for up to 7 days for cough. (Patient not taking: Reported on 10/23/2018) 140 mL 0  ° °No current facility-administered medications on file prior to visit.   ° ° °LMP 10/05/2018  ° ° °Objective:  ° Physical Exam  °Constitutional: She is oriented to person, place, and time. She appears well-nourished. She does not appear ill.  °Appears tired  °Respiratory: Effort normal.  °Neurological: She is alert and oriented to person, place, and time.  °Psychiatric: She has a normal mood and affect.  °  ° ° ° ° °   °Assessment & Plan:  ° °

## 2018-10-23 NOTE — Assessment & Plan Note (Signed)
Suspect symptoms are from a viral GI infection. She is able to hydrate and is eating. No diarrhea or Imodium for two days which is reassuring. Discussed to notify me if diarrhea reoccurs, fevers increase to above 100.8 consistently, she feels worse. At that point we will need to complete lab work up and potential imaging.

## 2018-11-13 LAB — HM DIABETES EYE EXAM

## 2018-11-14 NOTE — Progress Notes (Signed)
This encounter was created in error - please disregard.

## 2018-11-15 ENCOUNTER — Other Ambulatory Visit: Payer: Self-pay | Admitting: Primary Care

## 2018-11-15 DIAGNOSIS — J302 Other seasonal allergic rhinitis: Secondary | ICD-10-CM

## 2018-11-15 NOTE — Telephone Encounter (Signed)
See my chart message. Awaiting response. 

## 2018-11-15 NOTE — Telephone Encounter (Signed)
Last prescribed and seen on 10/13/2018. No future appointment.

## 2018-11-16 ENCOUNTER — Encounter: Payer: Self-pay | Admitting: Primary Care

## 2018-11-17 ENCOUNTER — Ambulatory Visit (INDEPENDENT_AMBULATORY_CARE_PROVIDER_SITE_OTHER): Payer: 59 | Admitting: Primary Care

## 2018-11-17 DIAGNOSIS — B370 Candidal stomatitis: Secondary | ICD-10-CM

## 2018-11-17 MED ORDER — CLOTRIMAZOLE 10 MG MT TROC: 10.0000 mg | Freq: Three times a day (TID) | OROMUCOSAL | 0 refills | Status: DC

## 2018-11-17 NOTE — Progress Notes (Signed)
Subjective:    Patient ID: Rachel Rice, female    DOB: 12-31-1980, 38 y.o.   MRN: [id number]  HPI  Virtual Visit via Video Note  I connected with Rachel Rice on 11/17/18 at  9:20 AM EDT by a video enabled telemedicine application and verified that I am speaking with the correct person using two identifiers.  Location: Patient: Home Provider: Office   I discussed the limitations of evaluation and management by telemedicine and the availability of in person appointments. The patient expressed understanding and agreed to proceed.  History of Present Illness:  Rachel Rice is a 38 year old female with a history of hypertension, type 2 diabetes who presents today with a chief complaint of "thrush".   She was first evaluated and treated for this on 10/03/18 with fluconazole 150 mg x 2. She was then evaluated on 10/10/18 with reports of improvement but without resolve. Given this she was treated with Nystatin solution on 10/17/18.    Since treatment with Nystatin she's noticed improvement but without resolve. She's noticing the white coating on her tongue with sore throat in he evenings. Also with post nasal drip and is doing better on Singulair. She's been using a Tea tree oil mouthwash and Probiotics without much improvement, this worked historically. She was treated with [facility]'s mouthwash per perinatology prior to delivery last year, this worked well.   She denies fevers, cough, shortness of breath.    Observations/Objective:  Alert and oriented. Appears well, not sickly. No distress. Speaking in complete sentences.  Very slight white discoloration to posterior aspect of tongue.  Appears improved from prior visits.  Assessment and Plan:  Exam today with very mild (if even) thrush. May not be thrush, but does have slight white discoloration. Given no treatment with fluconazole and nystatin, she may not have thrush. She will try to find out the ingredients within the mouthwash  that worked previously, she will send this information via my chart.   Discussed to continue with Singulair for allergies.  Follow Up Instructions:  Please message me with the name of the mouthwash you received previously.  Continue Singulair for allergies.  It was a pleasure to see you today! Allie Bossier, NP-C    I discussed the assessment and treatment plan with the patient. The patient was provided an opportunity to ask questions and all were answered. The patient agreed with the plan and demonstrated an understanding of the instructions.   The patient was advised to call back or seek an in-person evaluation if the symptoms worsen or if the condition fails to improve as anticipated.     Pleas Koch, NP    Review of Systems  Constitutional: Negative for fever.  HENT: Positive for postnasal drip and rhinorrhea. Negative for congestion.        Whitish color to posterior tongue  Respiratory: Negative for shortness of breath.   Allergic/Immunologic: Positive for environmental allergies.        Past Medical History:  Diagnosis Date   Advanced maternal age, 1st pregnancy, second trimester    Diabetes mellitus without complication ([place])    GERD (gastroesophageal reflux disease)    Hypertension    Preeclampsia in postpartum period    Preeclampsia in postpartum period      Social History   Socioeconomic History   Marital status: Married    Spouse name: Not on file   Number of children: Not on file   Years of education: Not on file   Highest  education level: Not on file  Occupational History   Not on file  Social Needs   Financial resource strain: Not on file   Food insecurity:    Worry: Not on file    Inability: Not on file   Transportation needs:    Medical: Not on file    Non-medical: Not on file  Tobacco Use   Smoking status: Never Smoker   Smokeless tobacco: Never Used  Substance and Sexual Activity   Alcohol use: Not Currently     Comment: rare   Drug use: No   Sexual activity: Yes  Lifestyle   Physical activity:    Days per week: Not on file    Minutes per session: Not on file   Stress: Not on file  Relationships   Social connections:    Talks on phone: Not on file    Gets together: Not on file    Attends religious service: Not on file    Active member of club or organization: Not on file    Attends meetings of clubs or organizations: Not on file    Relationship status: Not on file   Intimate partner violence:    Fear of current or ex partner: Not on file    Emotionally abused: Not on file    Physically abused: Not on file    Forced sexual activity: Not on file  Other Topics Concern   Not on file  Social History Narrative   Married.    No Children.   Claims adjuster.    Past Surgical History:  Procedure Laterality Date   CESAREAN SECTION     TYMPANOSTOMY TUBE PLACEMENT      Family History  Problem Relation Age of Onset   Ovarian cancer Paternal Grandmother 72   Hypertension Mother    Hypertension Father    Diabetes Father        secondary to agent orange exposure   Hypertension Brother    Heart disease Paternal Grandfather    Hypertension Brother    Breast cancer Neg Hx     Allergies  Allergen Reactions   Fluticasone Other (See Comments)    nosebleed nosebleed  Other reaction(s): Other (See Comments) nosebleed   Iodinated Diagnostic Agents Hives   Gadolinium Derivatives     Other reaction(s): Other (See Comments)   Latex Rash    Current Outpatient Medications on File Prior to Visit  Medication Sig Dispense Refill   ACCU-CHEK SOFTCLIX LANCETS lancets Use as instructed to test blood sugar 2 times daily E11.9 200 each 2   aspirin EC 81 MG tablet Take 81 mg by mouth daily.     Blood Glucose Monitoring Suppl (ACCU-CHEK AVIVA PLUS) w/Device KIT Use as instructed to test blood sugar daily 1 kit 0   glucose blood (ACCU-CHEK AVIVA PLUS) test strip Use as  instructed as instructed to test blood sugar 2 times daily 100 each 12   hydrocortisone (ANUSOL-HC) 2.5 % rectal cream Place 1 application rectally 2 (two) times daily. 30 g 0   lisinopril (PRINIVIL,ZESTRIL) 10 MG tablet Take 1 tablet (10 mg total) by mouth daily. For blood pressure. 30 tablet 0   Metformin HCl 500 MG/5ML SOLN Take 500 mg (5 ml) every morning and 1000 mg (10 ml) every evening for diabetes. 1350 mL 1   montelukast (SINGULAIR) 10 MG tablet TAKE 1 TABLET (10 MG TOTAL) BY MOUTH AT BEDTIME. FOR ALLERGIES. 90 tablet 0   Multiple Vitamin (MULTIVITAMIN) capsule Take 1 capsule by mouth  daily.     mupirocin ointment (BACTROBAN) 2 % Apply topically twice daily. 22 g 0   neomycin-polymyxin-hydrocortisone (CORTISPORIN) OTIC solution Apply 1-2 drops to toe after soaking BID 10 mL 1   norethindrone-ethinyl estradiol (JUNEL FE,GILDESS FE,LOESTRIN FE) 1-20 MG-MCG tablet Take 1 tablet by mouth daily.     nystatin (MYCOSTATIN) 100000 UNIT/ML suspension Take 5 mLs (500,000 Units total) by mouth 4 (four) times daily. 100 mL 0   ondansetron (ZOFRAN ODT) 4 MG disintegrating tablet Take 1 tablet (4 mg total) by mouth every 8 (eight) hours as needed for nausea or vomiting. 15 tablet 0   Probiotic Product (PROBIOTIC-10) CAPS Take 1 capsule by mouth daily.     No current facility-administered medications on file prior to visit.     There were no vitals taken for this visit.   Objective:   Physical Exam  Constitutional: She is oriented to person, place, and time. She appears well-nourished.  HENT:  Very slight whitish color to posterior tongue. No obvious lesions or erythema.   Respiratory: Effort normal.  Neurological: She is alert and oriented to person, place, and time.  Psychiatric: She has a normal mood and affect.           Assessment & Plan:

## 2018-11-17 NOTE — Patient Instructions (Signed)
Please message me with the name of the mouthwash you received previously.  Continue Singulair for allergies.  It was a pleasure to see you today! Mayra Reel, NP-C

## 2018-11-17 NOTE — Assessment & Plan Note (Signed)
Not clearly evident, but she does feel like this is thrush and has had it on numerous occasions. No resolve with fluconazole or nystatin. She will try to find out the ingredients within the mouthwash that worked previously, she will send this information via my chart.

## 2018-11-21 ENCOUNTER — Encounter: Payer: Self-pay | Admitting: Primary Care

## 2018-11-21 ENCOUNTER — Other Ambulatory Visit: Payer: Self-pay

## 2018-11-21 ENCOUNTER — Ambulatory Visit (INDEPENDENT_AMBULATORY_CARE_PROVIDER_SITE_OTHER): Payer: 59 | Admitting: Primary Care

## 2018-11-21 VITALS — BP 126/82 | HR 91 | Temp 98.2°F | Ht 65.0 in | Wt 165.2 lb

## 2018-11-21 DIAGNOSIS — R945 Abnormal results of liver function studies: Secondary | ICD-10-CM | POA: Diagnosis not present

## 2018-11-21 DIAGNOSIS — R7989 Other specified abnormal findings of blood chemistry: Secondary | ICD-10-CM | POA: Insufficient documentation

## 2018-11-21 DIAGNOSIS — B370 Candidal stomatitis: Secondary | ICD-10-CM

## 2018-11-21 DIAGNOSIS — M79605 Pain in left leg: Secondary | ICD-10-CM

## 2018-11-21 DIAGNOSIS — M79604 Pain in right leg: Secondary | ICD-10-CM

## 2018-11-21 LAB — COMPREHENSIVE METABOLIC PANEL
ALT: 25 U/L (ref 0–35)
AST: 17 U/L (ref 0–37)
Albumin: 4.5 g/dL (ref 3.5–5.2)
Alkaline Phosphatase: 68 U/L (ref 39–117)
BUN: 9 mg/dL (ref 6–23)
CO2: 26 mEq/L (ref 19–32)
Calcium: 10.1 mg/dL (ref 8.4–10.5)
Chloride: 101 mEq/L (ref 96–112)
Creatinine, Ser: 0.7 mg/dL (ref 0.40–1.20)
GFR: 92.76 mL/min (ref >60.00)
Glucose, Bld: 109 mg/dL — ABNORMAL HIGH (ref 70–99)
Potassium: 4.2 mEq/L (ref 3.5–5.1)
Sodium: 137 mEq/L (ref 135–145)
Total Bilirubin: 0.5 mg/dL (ref 0.2–1.2)
Total Protein: 8 g/dL (ref 6.0–8.3)

## 2018-11-21 LAB — CBC
HCT: 39.5 % (ref 36.0–46.0)
Hemoglobin: 13.6 g/dL (ref 12.0–15.0)
MCHC: 34.4 g/dL (ref 30.0–36.0)
MCV: 86.8 fl (ref 78.0–100.0)
Platelets: 297 10*3/uL (ref 150.0–400.0)
RBC: 4.55 Mil/uL (ref 3.87–5.11)
RDW: 16.3 % — ABNORMAL HIGH (ref 11.5–15.5)
WBC: 7.8 10*3/uL (ref 4.0–10.5)

## 2018-11-21 LAB — VITAMIN B12: Vitamin B-12: 510 pg/mL (ref 211–911)

## 2018-11-21 NOTE — Assessment & Plan Note (Signed)
Chronic, intermittent since pregnancy last year. Exam today without evidence of DVT. She appears to be well hydrated, endorses a healthy diet.   Recommended she start stretching before bed and when waking in the morning. Check CMP, B12, and CBC today.   Also discussed the application of topical agents PRN. She will update.

## 2018-11-21 NOTE — Patient Instructions (Signed)
Stop by the lab prior to leaving today. I will notify you of your results once received.   Try stretching before bed and every morning as discussed.  You can rub on topical agents such as [facility] or [place] to help with symptoms.  Start omeprazole once daily for at least 2 weeks, update me if your GERD persists.  It was a pleasure to see you today!

## 2018-11-21 NOTE — Assessment & Plan Note (Signed)
Noted from prior labs. Repeat CMP pending today. History of liver lesion that appears benign, due for repeat MRI this Summer.

## 2018-11-21 NOTE — Assessment & Plan Note (Signed)
Slightly improved with clotrimazole troches, she will continue to update.

## 2018-11-21 NOTE — Progress Notes (Signed)
Subjective:    Patient ID: Rachel Rice, female    DOB: 1980/09/13, 38 y.o.   MRN: [id number]  HPI  Rachel Rice is a 38 year old female with a history of type 2 diabetes, hypertension, restless leg syndrome (during pregnancy) who presents today with a chief complaint of lower extremity pain. She also reports esophageal reflux.  1) Lower Extremity Pain: Chronic, intermittent since early pregnancy. Her pain is located to the bilateral lower extremities from the proximal through distal calves, left more than right. Her pain will occur nightly around 9 pm and will continue intermittently throughout the night. She has been walking more in her neighborhood since March 2020, also has steep steps for which she's using more.  During her pregnancy she had "charley horse" type pain to to the legs intermittently, this feels similar.    She denies swelling, redness, long travel. She endorses a healthy diet with fruit and spinach, drinks mostly water. She is not stretching and is not applying anything topically.   2) GERD: Symptoms include mid chest pressure, fullness/heaviness, belching. Symptoms became worse when bending forward this morning. She's taken Tum's today with some improvement but without resolve.     Review of Systems  Constitutional: Negative for fever.  Respiratory: Negative for shortness of breath.   Gastrointestinal: Negative for nausea.       Esophageal pressure/fullness  Musculoskeletal: Positive for myalgias.  Skin: Negative for color change and wound.       Past Medical History:  Diagnosis Date  . Advanced maternal age, 1st pregnancy, second trimester   . Diabetes mellitus without complication ([place])   . GERD (gastroesophageal reflux disease)   . Hypertension   . Preeclampsia in postpartum period   . Preeclampsia in postpartum period      Social History   Socioeconomic History  . Marital status: Married    Spouse name: Not on file  . Number of children: Not on file   . Years of education: Not on file  . Highest education level: Not on file  Occupational History  . Not on file  Social Needs  . Financial resource strain: Not on file  . Food insecurity:    Worry: Not on file    Inability: Not on file  . Transportation needs:    Medical: Not on file    Non-medical: Not on file  Tobacco Use  . Smoking status: Never Smoker  . Smokeless tobacco: Never Used  Substance and Sexual Activity  . Alcohol use: Not Currently    Comment: rare  . Drug use: No  . Sexual activity: Yes  Lifestyle  . Physical activity:    Days per week: Not on file    Minutes per session: Not on file  . Stress: Not on file  Relationships  . Social connections:    Talks on phone: Not on file    Gets together: Not on file    Attends religious service: Not on file    Active member of club or organization: Not on file    Attends meetings of clubs or organizations: Not on file    Relationship status: Not on file  . Intimate partner violence:    Fear of current or ex partner: Not on file    Emotionally abused: Not on file    Physically abused: Not on file    Forced sexual activity: Not on file  Other Topics Concern  . Not on file  Social History Narrative   Married.  No Children.   Claims adjuster.    Past Surgical History:  Procedure Laterality Date  . CESAREAN SECTION    . TYMPANOSTOMY TUBE PLACEMENT      Family History  Problem Relation Age of Onset  . Ovarian cancer Paternal Grandmother 69  . Hypertension Mother   . Hypertension Father   . Diabetes Father        secondary to agent orange exposure  . Hypertension Brother   . Heart disease Paternal Grandfather   . Hypertension Brother   . Breast cancer Neg Hx     Allergies  Allergen Reactions  . Fluticasone Other (See Comments)    nosebleed nosebleed  Other reaction(s): Other (See Comments) nosebleed  . Iodinated Diagnostic Agents Hives  . Gadolinium Derivatives     Other reaction(s): Other  (See Comments)  . Latex Rash    Current Outpatient Medications on File Prior to Visit  Medication Sig Dispense Refill  . ACCU-CHEK SOFTCLIX LANCETS lancets Use as instructed to test blood sugar 2 times daily E11.9 200 each 2  . aspirin EC 81 MG tablet Take 81 mg by mouth daily.    . Blood Glucose Monitoring Suppl (ACCU-CHEK AVIVA PLUS) w/Device KIT Use as instructed to test blood sugar daily 1 kit 0  . clotrimazole (MYCELEX) 10 MG troche Take 1 tablet (10 mg total) by mouth 3 (three) times daily. 15 tablet 0  . glucose blood (ACCU-CHEK AVIVA PLUS) test strip Use as instructed as instructed to test blood sugar 2 times daily 100 each 12  . lisinopril (PRINIVIL,ZESTRIL) 10 MG tablet Take 1 tablet (10 mg total) by mouth daily. For blood pressure. 30 tablet 0  . Metformin HCl 500 MG/5ML SOLN Take 500 mg (5 ml) every morning and 1000 mg (10 ml) every evening for diabetes. 1350 mL 1  . montelukast (SINGULAIR) 10 MG tablet TAKE 1 TABLET (10 MG TOTAL) BY MOUTH AT BEDTIME. FOR ALLERGIES. 90 tablet 0  . Multiple Vitamin (MULTIVITAMIN) capsule Take 1 capsule by mouth daily.    . mupirocin ointment (BACTROBAN) 2 % Apply topically twice daily. 22 g 0  . neomycin-polymyxin-hydrocortisone (CORTISPORIN) OTIC solution Apply 1-2 drops to toe after soaking BID 10 mL 1  . norethindrone-ethinyl estradiol (JUNEL FE,GILDESS FE,LOESTRIN FE) 1-20 MG-MCG tablet Take 1 tablet by mouth daily.    Marland Kitchen nystatin (MYCOSTATIN) 100000 UNIT/ML suspension Take 5 mLs (500,000 Units total) by mouth 4 (four) times daily. 100 mL 0  . ondansetron (ZOFRAN ODT) 4 MG disintegrating tablet Take 1 tablet (4 mg total) by mouth every 8 (eight) hours as needed for nausea or vomiting. 15 tablet 0  . Probiotic Product (PROBIOTIC-10) CAPS Take 1 capsule by mouth daily.     No current facility-administered medications on file prior to visit.     BP 126/82   Pulse 91   Temp 98.2 F (36.8 C) (Oral)   Ht _0  (1.651 m)   Wt 165 lb 4 oz (75 kg)    LMP 11/03/2018   SpO2 98%   BMI 27.50 kg/m    Objective:   Physical Exam  Constitutional: She appears well-nourished.  Neck: Neck supple.  Cardiovascular: Normal rate and regular rhythm.  Calf circumference equal. No lower extremity erythema, tenderness, warmth, swelling.  Respiratory: Effort normal and breath sounds normal.  Skin: Skin is warm and dry. No erythema.  Psychiatric: She has a normal mood and affect.           Assessment & Plan:

## 2018-11-22 DIAGNOSIS — B370 Candidal stomatitis: Secondary | ICD-10-CM

## 2018-11-22 MED ORDER — FLUCONAZOLE 100 MG PO TABS: ORAL_TABLET | ORAL | 0 refills | Status: AC

## 2018-12-01 ENCOUNTER — Other Ambulatory Visit: Payer: Self-pay | Admitting: Primary Care

## 2018-12-01 DIAGNOSIS — I10 Essential (primary) hypertension: Secondary | ICD-10-CM

## 2018-12-08 DIAGNOSIS — B370 Candidal stomatitis: Secondary | ICD-10-CM

## 2018-12-08 MED ORDER — CLOTRIMAZOLE 10 MG MT TROC: 10.0000 mg | Freq: Three times a day (TID) | OROMUCOSAL | 0 refills | Status: DC

## 2018-12-14 ENCOUNTER — Telehealth: Payer: Self-pay | Admitting: Primary Care

## 2018-12-14 NOTE — Telephone Encounter (Signed)
Left message asking pt to call office regarding 6/10 my chart message  Appointment Request From: Sumner Boast  With Provider: Pleas Koch, NP [facility]  Preferred Date Range: 12/11/2018 - 12/11/2018  Preferred Times: Any time  Reason for visit: Office Visit  Comments: 6 month diabetes check up

## 2018-12-15 ENCOUNTER — Other Ambulatory Visit: Payer: Self-pay

## 2018-12-15 ENCOUNTER — Other Ambulatory Visit: Payer: Self-pay | Admitting: Primary Care

## 2018-12-15 DIAGNOSIS — B370 Candidal stomatitis: Secondary | ICD-10-CM

## 2018-12-15 DIAGNOSIS — E119 Type 2 diabetes mellitus without complications: Secondary | ICD-10-CM

## 2018-12-15 MED ORDER — METFORMIN HCL 500 MG PO TABS: ORAL_TABLET | ORAL | 0 refills | Status: DC

## 2018-12-15 NOTE — Telephone Encounter (Signed)
Last prescribed on 11/22/2018. Last appointment on 11/21/2018. No future appointment

## 2018-12-15 NOTE — Telephone Encounter (Signed)
See my chart message

## 2018-12-18 NOTE — Telephone Encounter (Signed)
Called to get this rescheduled. No answer and voicemail is full. I sent a message through [place].

## 2018-12-19 ENCOUNTER — Other Ambulatory Visit: Payer: Self-pay | Admitting: Primary Care

## 2018-12-19 DIAGNOSIS — I10 Essential (primary) hypertension: Secondary | ICD-10-CM

## 2018-12-19 DIAGNOSIS — E119 Type 2 diabetes mellitus without complications: Secondary | ICD-10-CM

## 2018-12-20 ENCOUNTER — Other Ambulatory Visit (INDEPENDENT_AMBULATORY_CARE_PROVIDER_SITE_OTHER): Payer: 59

## 2018-12-20 DIAGNOSIS — I10 Essential (primary) hypertension: Secondary | ICD-10-CM

## 2018-12-20 DIAGNOSIS — E119 Type 2 diabetes mellitus without complications: Secondary | ICD-10-CM | POA: Diagnosis not present

## 2018-12-20 LAB — LIPID PANEL
Cholesterol: 171 mg/dL (ref 0–200)
HDL: 37.5 mg/dL — ABNORMAL LOW (ref >39.00)
LDL Cholesterol: 94 mg/dL (ref 0–99)
NonHDL: 133.18
Total CHOL/HDL Ratio: 5
Triglycerides: 195 mg/dL — ABNORMAL HIGH (ref 0.0–149.0)
VLDL: 39 mg/dL (ref 0.0–40.0)

## 2018-12-20 LAB — COMPREHENSIVE METABOLIC PANEL
ALT: 17 U/L (ref 0–35)
AST: 13 U/L (ref 0–37)
Albumin: 4.5 g/dL (ref 3.5–5.2)
Alkaline Phosphatase: 59 U/L (ref 39–117)
BUN: 12 mg/dL (ref 6–23)
CO2: 26 mEq/L (ref 19–32)
Calcium: 9.2 mg/dL (ref 8.4–10.5)
Chloride: 103 mEq/L (ref 96–112)
Creatinine, Ser: 0.68 mg/dL (ref 0.40–1.20)
GFR: 95.88 mL/min (ref >60.00)
Glucose, Bld: 121 mg/dL — ABNORMAL HIGH (ref 70–99)
Potassium: 4.2 mEq/L (ref 3.5–5.1)
Sodium: 138 mEq/L (ref 135–145)
Total Bilirubin: 0.5 mg/dL (ref 0.2–1.2)
Total Protein: 7.5 g/dL (ref 6.0–8.3)

## 2018-12-20 LAB — HEMOGLOBIN A1C: Hgb A1c MFr Bld: 6.5 % (ref 4.6–6.5)

## 2018-12-21 ENCOUNTER — Other Ambulatory Visit: Payer: Self-pay

## 2018-12-21 ENCOUNTER — Ambulatory Visit (INDEPENDENT_AMBULATORY_CARE_PROVIDER_SITE_OTHER): Payer: 59 | Admitting: Primary Care

## 2018-12-21 ENCOUNTER — Encounter: Payer: Self-pay | Admitting: Primary Care

## 2018-12-21 VITALS — BP 126/82 | HR 100 | Temp 98.7°F | Ht 64.5 in | Wt 161.0 lb

## 2018-12-21 DIAGNOSIS — B370 Candidal stomatitis: Secondary | ICD-10-CM

## 2018-12-21 DIAGNOSIS — I10 Essential (primary) hypertension: Secondary | ICD-10-CM

## 2018-12-21 DIAGNOSIS — N390 Urinary tract infection, site not specified: Secondary | ICD-10-CM

## 2018-12-21 DIAGNOSIS — J302 Other seasonal allergic rhinitis: Secondary | ICD-10-CM

## 2018-12-21 DIAGNOSIS — Z0000 Encounter for general adult medical examination without abnormal findings: Secondary | ICD-10-CM | POA: Diagnosis not present

## 2018-12-21 DIAGNOSIS — K769 Liver disease, unspecified: Secondary | ICD-10-CM | POA: Diagnosis not present

## 2018-12-21 DIAGNOSIS — Z23 Encounter for immunization: Secondary | ICD-10-CM | POA: Diagnosis not present

## 2018-12-21 DIAGNOSIS — E119 Type 2 diabetes mellitus without complications: Secondary | ICD-10-CM

## 2018-12-21 MED ORDER — METFORMIN HCL 500 MG/5ML PO SOLN: ORAL | 1 refills | Status: DC

## 2018-12-21 NOTE — Assessment & Plan Note (Signed)
Treated numerous times for "thrush" without improvement. Will obtain culture for further evaluation.

## 2018-12-21 NOTE — Addendum Note (Signed)
Addended by: Tammi Sou on: 12/21/2018 12:27 PM   Modules accepted: Orders

## 2018-12-21 NOTE — Assessment & Plan Note (Signed)
LFT's normal on recent labs.

## 2018-12-21 NOTE — Assessment & Plan Note (Signed)
Doing well on Singulair as needed. Continue same.

## 2018-12-21 NOTE — Patient Instructions (Signed)
We've increased your dose of Metformin to 1000 mg twice daily.  You were provided with a pneumonia vaccination which will cover you for 5 years..   You will be contacted regarding your MRI.  Please let us know if you have not been contacted within one week.   Start exercising. You should be getting 150 minutes of moderate intensity exercise weekly.  It's important to improve your diet by reducing consumption of fast food, fried food, processed snack foods, sugary drinks. Increase consumption of fresh vegetables and fruits, whole grains, water.  Ensure you are drinking 64 ounces of water daily.  It was a pleasure to see you today!

## 2018-12-21 NOTE — Assessment & Plan Note (Signed)
Stable in the office today, continue lisinopril 10 mg daily. BMP reviewed.

## 2018-12-21 NOTE — Assessment & Plan Note (Signed)
Tetanus UTD, pneumovax due and provided. Pap smear UTD. Mammogram due in Summer 2020. Encouraged a healthy diet and regular exercise. Exam unremarkable. Labs reviewed. Follow up in 1 year for CPE

## 2018-12-21 NOTE — Assessment & Plan Note (Signed)
Recent A1C increased but stable, patient would like better control. Will increase Metformin to 1000 mg BID, new Rx sent to pharmacy.  Managed on ACE. LDL at goal. Foot exam today. Eye exam UTD. Pneumovax provided today.  Follow up in 6 months with [place] or her new PCP as she will be moving.

## 2018-12-21 NOTE — Progress Notes (Signed)
Subjective:    Patient ID: Rachel Rice, female    DOB: [birth year], 38 y.o.   MRN: [id number]  HPI  Rachel Rice is a 38 year old female who presents today for complete physical and general follow up.  Immunizations: -Tetanus: Completed in 2019 -Influenza: Completes annually  -Pneumonia: Never completed  Diet: She endorses a fair diet. She is mostly cooking at home.  Diet currently consists of:  Breakfast: Egg, fruit, english muffin Lunch: Chicken, vegetale Dinner: Meat, vegetable  Snacks: String cheese, nuts Desserts: 3-4 times weekly  Beverages: Coffee, water mostly  Exercise: She is not exercising   Eye exam: Due in May 2021 Dental exam: Completes annually  Pap Smear: Completed in 2018, follows with GYN  Mammogram: Due at 38  BP Readings from Last 3 Encounters:  12/21/18 126/82  11/21/18 126/82  10/13/18 122/86    She is checking her glucose levels 2-3 times daily:  AM fasting: 120's-159 2 hours after breakfast: 120's-130's 2 hours after lunch: 115's-140's 2 hours after dinner: 120-140's  Review of Systems  Constitutional: Negative for unexpected weight change.  HENT: Negative for rhinorrhea.   Respiratory: Negative for cough and shortness of breath.   Cardiovascular: Negative for chest pain.  Gastrointestinal: Negative for constipation and diarrhea.       Belching   Genitourinary: Negative for difficulty urinating.  Musculoskeletal: Negative for arthralgias and myalgias.  Skin: Negative for rash.  Allergic/Immunologic: Negative for environmental allergies.  Neurological: Negative for numbness and headaches.  Psychiatric/Behavioral:       Some anxiety and stress with working and taking care of her infant. Will also be moving soon.       Past Medical History:  Diagnosis Date  . Advanced maternal age, 1st pregnancy, second trimester   . Diabetes mellitus without complication ([place])   . GERD (gastroesophageal reflux disease)   . Hypertension   .  Preeclampsia in postpartum period   . Preeclampsia in postpartum period      Social History   Socioeconomic History  . Marital status: Married    Spouse name: Not on file  . Number of children: Not on file  . Years of education: Not on file  . Highest education level: Not on file  Occupational History  . Not on file  Social Needs  . Financial resource strain: Not on file  . Food insecurity    Worry: Not on file    Inability: Not on file  . Transportation needs    Medical: Not on file    Non-medical: Not on file  Tobacco Use  . Smoking status: Never Smoker  . Smokeless tobacco: Never Used  Substance and Sexual Activity  . Alcohol use: Not Currently    Comment: rare  . Drug use: No  . Sexual activity: Yes  Lifestyle  . Physical activity    Days per week: Not on file    Minutes per session: Not on file  . Stress: Not on file  Relationships  . Social Herbalist on phone: Not on file    Gets together: Not on file    Attends religious service: Not on file    Active member of club or organization: Not on file    Attends meetings of clubs or organizations: Not on file    Relationship status: Not on file  . Intimate partner violence    Fear of current or ex partner: Not on file    Emotionally abused: Not on file  Physically abused: Not on file    Forced sexual activity: Not on file  Other Topics Concern  . Not on file  Social History Narrative   Married.    No Children.   Claims adjuster.    Past Surgical History:  Procedure Laterality Date  . CESAREAN SECTION    . TYMPANOSTOMY TUBE PLACEMENT      Family History  Problem Relation Age of Onset  . Ovarian cancer Paternal Grandmother 41  . Hypertension Mother   . Mitral valve prolapse Mother   . Hypertension Father   . Diabetes Father        secondary to agent orange exposure  . Hypertension Brother   . Heart disease Paternal Grandfather   . Hypertension Brother   . Breast cancer Neg Hx      Allergies  Allergen Reactions  . Fluticasone Other (See Comments)    nosebleed nosebleed  Other reaction(s): Other (See Comments) nosebleed  . Iodinated Diagnostic Agents Hives  . Gadolinium Derivatives     Other reaction(s): Other (See Comments)  . Latex Rash    Current Outpatient Medications on File Prior to Visit  Medication Sig Dispense Refill  . ACCU-CHEK SOFTCLIX LANCETS lancets Use as instructed to test blood sugar 2 times daily E11.9 200 each 2  . aspirin EC 81 MG tablet Take 81 mg by mouth daily.    . Blood Glucose Monitoring Suppl (ACCU-CHEK AVIVA PLUS) w/Device KIT Use as instructed to test blood sugar daily 1 kit 0  . fluconazole (DIFLUCAN) 100 MG tablet Take 2 tablets by mouth on day one, then 1 tablet day for 6 additional days. 8 tablet 0  . glucose blood (ACCU-CHEK AVIVA PLUS) test strip Use as instructed as instructed to test blood sugar 2 times daily 100 each 12  . lisinopril (ZESTRIL) 10 MG tablet TAKE 1 TABLET BY MOUTH EVERY DAY 90 tablet 1  . Metformin HCl (RIOMET) 500 MG/5ML SOLN TAKE (5 ML) EVERY MORNING AND (10 ML) EVERY EVENING FOR DIABETES. 1350 mL 1  . montelukast (SINGULAIR) 10 MG tablet TAKE 1 TABLET (10 MG TOTAL) BY MOUTH AT BEDTIME. FOR ALLERGIES. 90 tablet 0  . Multiple Vitamin (MULTIVITAMIN) capsule Take 1 capsule by mouth daily.    . mupirocin ointment (BACTROBAN) 2 % Apply topically twice daily. 22 g 0  . neomycin-polymyxin-hydrocortisone (CORTISPORIN) OTIC solution Apply 1-2 drops to toe after soaking BID 10 mL 1  . norethindrone-ethinyl estradiol (JUNEL FE,GILDESS FE,LOESTRIN FE) 1-20 MG-MCG tablet Take 1 tablet by mouth daily.    Marland Kitchen nystatin (MYCOSTATIN) 100000 UNIT/ML suspension Take 5 mLs (500,000 Units total) by mouth 4 (four) times daily. 100 mL 0  . ondansetron (ZOFRAN ODT) 4 MG disintegrating tablet Take 1 tablet (4 mg total) by mouth every 8 (eight) hours as needed for nausea or vomiting. 15 tablet 0  . Probiotic Product (PROBIOTIC-10) CAPS  Take 1 capsule by mouth daily.     No current facility-administered medications on file prior to visit.     BP 126/82 (BP Location: Right Arm, Patient Position: Sitting, Cuff Size: Normal)   Pulse 100   Temp 98.7 F (37.1 C) (Tympanic)   Ht 5' 4.5" (1.638 m)   Wt 161 lb (73 kg)   LMP 12/07/2018   SpO2 97%   BMI 27.21 kg/m    Objective:   Physical Exam  Constitutional: She is oriented to person, place, and time. She appears well-nourished.  HENT:  Mouth/Throat: No oropharyngeal exudate.  Very slight whitish  color to entire tongue, did not remove with q-tip.   Eyes: Pupils are equal, round, and reactive to light. EOM are normal.  Neck: Neck supple. No thyromegaly present.  Cardiovascular: Normal rate and regular rhythm.  Respiratory: Effort normal and breath sounds normal.  GI: Soft. Bowel sounds are normal. There is no abdominal tenderness.  Musculoskeletal: Normal range of motion.  Neurological: She is alert and oriented to person, place, and time.  Skin: Skin is warm and dry.  Psychiatric: She has a normal mood and affect.           Assessment & Plan:

## 2018-12-21 NOTE — Assessment & Plan Note (Signed)
Due for repeat MRI now, orders placed.

## 2018-12-21 NOTE — Addendum Note (Signed)
Addended by: Ellamae Sia on: 12/21/2018 12:47 PM   Modules accepted: Orders

## 2018-12-21 NOTE — Assessment & Plan Note (Signed)
No recent UTI.

## 2018-12-22 LAB — SPECIMEN STATUS REPORT

## 2018-12-25 ENCOUNTER — Telehealth: Payer: Self-pay | Admitting: Primary Care

## 2018-12-25 ENCOUNTER — Other Ambulatory Visit: Payer: 59

## 2018-12-25 DIAGNOSIS — K769 Liver disease, unspecified: Secondary | ICD-10-CM

## 2018-12-25 NOTE — Telephone Encounter (Signed)
Message left for Fran to return my call.

## 2018-12-25 NOTE — Telephone Encounter (Signed)
Rachel Rice has been notified of the order changed.

## 2018-12-25 NOTE — Telephone Encounter (Signed)
Noted. Please notify Manus Gunning that order was changed.

## 2018-12-25 NOTE — Telephone Encounter (Signed)
Manus Gunning [phone] ext [phone] with [facility] called. Dr Marily Lente said the orders given for MRI are not appropriate. He recommends MRI w/o contrast or CT scan. (Pt said it is not worth her health for 13 hrs prep.)  Appt was set for 7/21 but has been canceled waiting on corrected order. Manus Gunning is requesting a cb with new test ordered.

## 2018-12-27 ENCOUNTER — Encounter: Payer: 59 | Admitting: Primary Care

## 2018-12-28 ENCOUNTER — Ambulatory Visit (INDEPENDENT_AMBULATORY_CARE_PROVIDER_SITE_OTHER): Payer: 59 | Admitting: Family Medicine

## 2018-12-28 ENCOUNTER — Encounter: Payer: Self-pay | Admitting: Family Medicine

## 2018-12-28 ENCOUNTER — Other Ambulatory Visit: Payer: Self-pay

## 2018-12-28 VITALS — BP 94/72 | HR 99 | Temp 98.5°F | Ht 64.5 in | Wt 162.5 lb

## 2018-12-28 DIAGNOSIS — J358 Other chronic diseases of tonsils and adenoids: Secondary | ICD-10-CM | POA: Diagnosis not present

## 2018-12-28 DIAGNOSIS — J029 Acute pharyngitis, unspecified: Secondary | ICD-10-CM | POA: Diagnosis not present

## 2018-12-28 DIAGNOSIS — Q383 Other congenital malformations of tongue: Secondary | ICD-10-CM

## 2018-12-28 LAB — POCT RAPID STREP A (OFFICE): Rapid Strep A Screen: NEGATIVE

## 2018-12-28 NOTE — Patient Instructions (Signed)
   Sore Throat 1) Honey as above, cough drops 2) Ibuprofen or Aleve can be helpful 3) Salt water Gargles 4) restart allergy medication as post-nasal drip may be triggering  Tongue issue - results not back yet - referral to ENT for continued care

## 2018-12-28 NOTE — Progress Notes (Signed)
Subjective:     Rachel Rice is a 38 y.o. female presenting for Sore Throat (swelling feeling in her throat)     HPI   #Sore throat - has been dealing with the coating on the tongue for a while - has tried several course of treatment for thrush - has been using lozanges sparingly  - last weekend started feeling a swelling in her throat - has been drinking a lot of water - sucking on candy/mints is helpful - has been taking benadryl at night which helps with the swelling - started on 12/23/2018, 6/28 it was coming and going, and now constant - has also noticed a tonsil stone which recently developed   Review of Systems  Constitutional: Negative for chills and fever.  HENT: Positive for postnasal drip and trouble swallowing. Negative for congestion, rhinorrhea, sinus pressure and sinus pain.   Eyes: Negative for pain and discharge.  Respiratory: Positive for cough.   Gastrointestinal: Negative for nausea and vomiting.       Heartburn  Musculoskeletal: Negative for arthralgias and myalgias.  no loss of sense of taste or smell   Social History   Tobacco Use  Smoking Status Never Smoker  Smokeless Tobacco Never Used        Objective:    BP Readings from Last 3 Encounters:  12/28/18 94/72  12/21/18 126/82  11/21/18 126/82   Wt Readings from Last 3 Encounters:  12/28/18 162 lb 8 oz (73.7 kg)  12/21/18 161 lb (73 kg)  11/21/18 165 lb 4 oz (75 kg)    BP 94/72   Pulse 99   Temp 98.5 F (36.9 C)   Ht 5' 4.5" (1.638 m)   Wt 162 lb 8 oz (73.7 kg)   LMP 12/07/2018   BMI 27.46 kg/m    Physical Exam Constitutional:      General: She is not in acute distress.    Appearance: She is well-developed. She is not diaphoretic.  HENT:     Head: Normocephalic and atraumatic.     Right Ear: Tympanic membrane and ear canal normal.     Left Ear: Tympanic membrane and ear canal normal.     Nose: Mucosal edema and rhinorrhea present.     Right Sinus: No maxillary sinus  tenderness or frontal sinus tenderness.     Left Sinus: No maxillary sinus tenderness or frontal sinus tenderness.     Mouth/Throat:     Pharynx: Uvula midline. No oropharyngeal exudate or posterior oropharyngeal erythema.     Tonsils: 1+ on the right. 0 on the left.     Comments: Left tonsil stone present Eyes:     General: No scleral icterus.    Conjunctiva/sclera: Conjunctivae normal.  Neck:     Musculoskeletal: Neck supple.  Cardiovascular:     Rate and Rhythm: Normal rate and regular rhythm.     Heart sounds: Normal heart sounds. No murmur.  Pulmonary:     Effort: Pulmonary effort is normal. No respiratory distress.     Breath sounds: Normal breath sounds.  Lymphadenopathy:     Cervical: No cervical adenopathy.  Skin:    General: Skin is warm and dry.     Capillary Refill: Capillary refill takes less than 2 seconds.  Neurological:     Mental Status: She is alert.    Rapid strep: negative        Assessment & Plan:   Problem List Items Addressed This Visit      Digestive  Tongue abnormality    Pt has had several rounds of treatment for possible thrush. No evidence of thrush on exam today, but some tongue discoloration and pt notes swelling. F/u culture already obtained, but referral to ENT so if not improving we can get a specialist opinion on symptoms      Relevant Orders   Ambulatory referral to ENT     Other   Sore throat - Primary    Rapid strep negative. Culture still pending. Discussed that may be allergy related and recommended allergy treatment. Referral to ENT for tongue work-up      Relevant Orders   POCT rapid strep A (Completed)    Other Visit Diagnoses    Tonsil stone           Return if symptoms worsen or fail to improve.  Lesleigh Noe, MD

## 2018-12-28 NOTE — Assessment & Plan Note (Signed)
Rapid strep negative. Culture still pending. Discussed that may be allergy related and recommended allergy treatment. Referral to ENT for tongue work-up

## 2018-12-28 NOTE — Assessment & Plan Note (Signed)
Pt has had several rounds of treatment for possible thrush. No evidence of thrush on exam today, but some tongue discoloration and pt notes swelling. F/u culture already obtained, but referral to ENT so if not improving we can get a specialist opinion on symptoms

## 2019-01-03 LAB — YEAST ONLY, CULTURE

## 2019-01-08 ENCOUNTER — Other Ambulatory Visit: Payer: Self-pay | Admitting: Primary Care

## 2019-01-16 ENCOUNTER — Other Ambulatory Visit: Payer: 59

## 2019-01-17 ENCOUNTER — Other Ambulatory Visit: Payer: Self-pay

## 2019-01-17 ENCOUNTER — Ambulatory Visit
Admission: RE | Admit: 2019-01-17 | Discharge: 2019-01-17 | Disposition: A | Discharge status: Home / Self Care | Payer: 59 | Source: Ambulatory Visit | Attending: Primary Care | Admitting: Primary Care

## 2019-01-17 DIAGNOSIS — K769 Liver disease, unspecified: Secondary | ICD-10-CM

## 2019-02-09 ENCOUNTER — Other Ambulatory Visit: Payer: Self-pay | Admitting: Primary Care

## 2019-02-09 DIAGNOSIS — J302 Other seasonal allergic rhinitis: Secondary | ICD-10-CM

## 2019-02-16 NOTE — Telephone Encounter (Signed)
I received the prior auth yesterday and already completed the request this morning.

## 2019-02-19 NOTE — Telephone Encounter (Signed)
The fax for additional clinical information have already been send to prior auth department. No need to call

## 2019-02-19 NOTE — Telephone Encounter (Signed)
According to [facility] [employer] PA and appeals dept.  Prior authorization is not required.

## 2019-02-19 NOTE — Telephone Encounter (Signed)
[facility] called back stated that a call is needed for the PA. She stated that the information received is not enough.  I asked Overton Mam if she knew what other information is needed and she stated that she could not tell that the PA dept requested a call so they could do this over the phone and have the medication approved for the patient     # [phone]

## 2019-02-19 NOTE — Telephone Encounter (Signed)
[facility] called about prior authorization and they asked for Vallarie Mare to call prior authorization department phone [phone].

## 2019-07-31 ENCOUNTER — Other Ambulatory Visit: Payer: Self-pay | Admitting: Primary Care

## 2019-07-31 DIAGNOSIS — E119 Type 2 diabetes mellitus without complications: Secondary | ICD-10-CM

## 2019-11-20 ENCOUNTER — Other Ambulatory Visit: Payer: Self-pay | Admitting: Primary Care

## 2020-08-13 IMAGING — CT CT ABD-PELV W/O CM
2 of 4 series · 15 of 46 positions shown, 17 images · non-contrast
Comparison: 11/14/2017 MRI abdomen and pelvis.

CLINICAL DATA: Right lower quadrant abdominal pain and hypertension
6 days after cesarean delivery.

EXAM:
CT ABDOMEN AND PELVIS WITHOUT CONTRAST
TECHNIQUE: Multidetector CT imaging of the abdomen and pelvis was performed
following the standard protocol without IV contrast.

[Series 2: routine abd/pel wo · axial · 0.86mm/px · z∈[-1196,-766]mm · 12 of 94 slices shown, 14 images]
[im 4/94  soft-tissue]
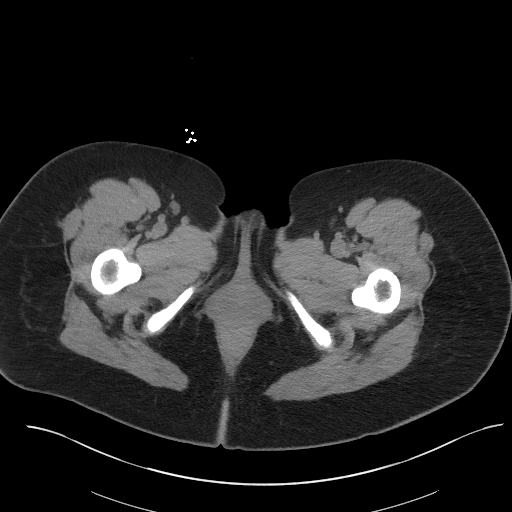
[im 4/94  bone]
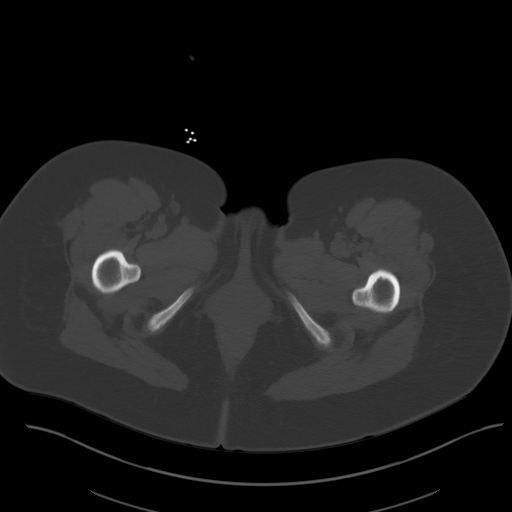
[im 12/94  soft-tissue]
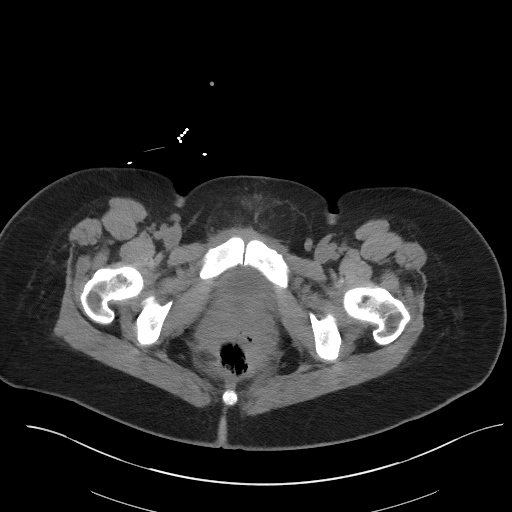
[im 20/94  soft-tissue]
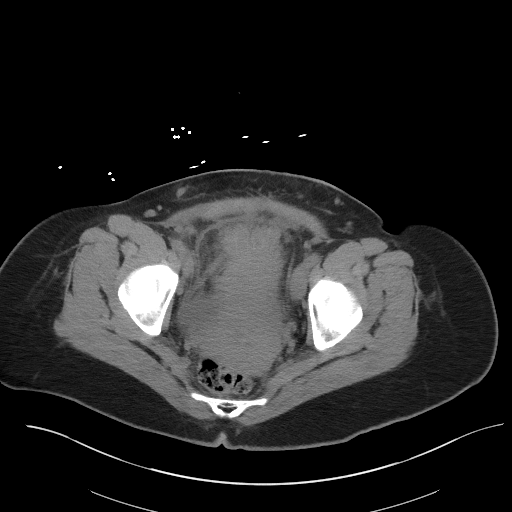
[im 28/94  soft-tissue]
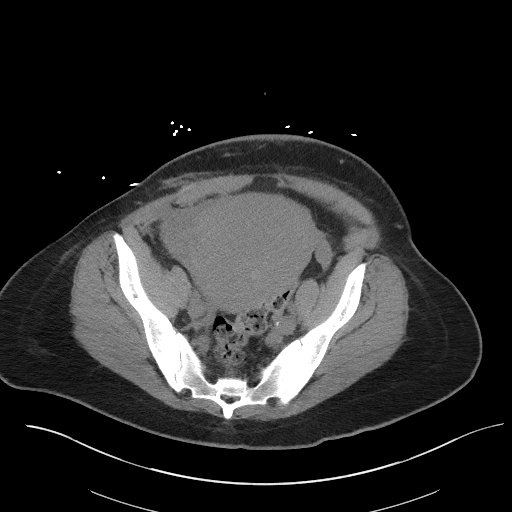
[im 35/94  soft-tissue]
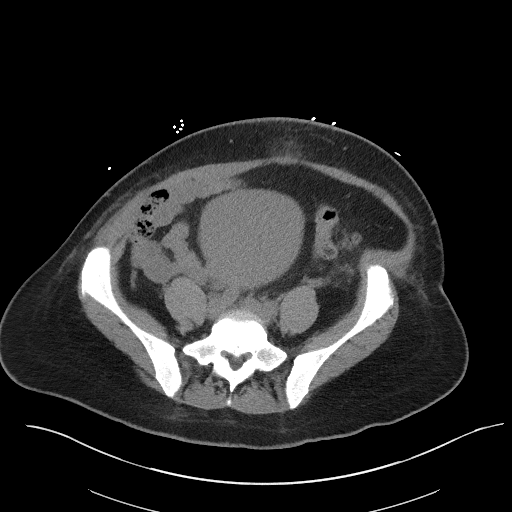
[im 43/94  soft-tissue]
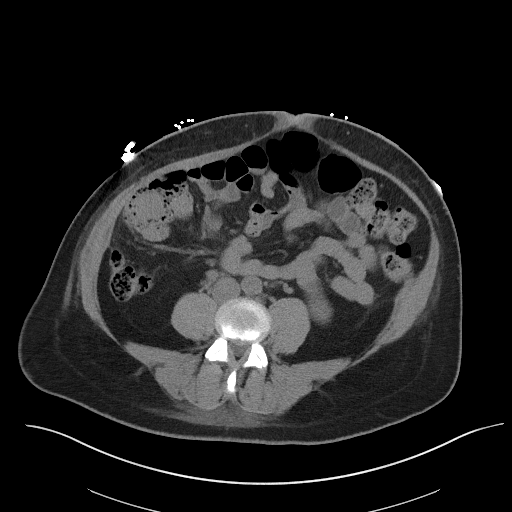
[im 51/94  soft-tissue]
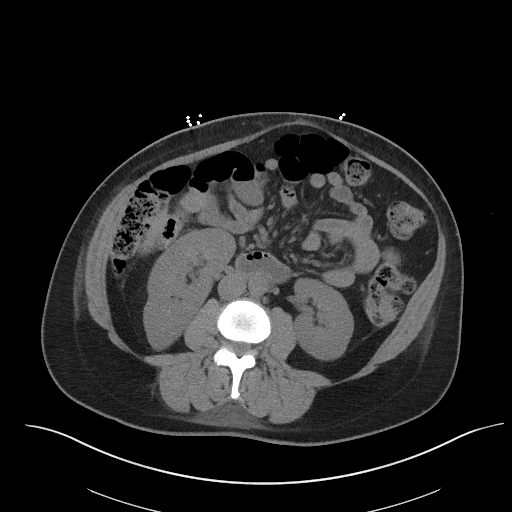
[im 59/94  soft-tissue]
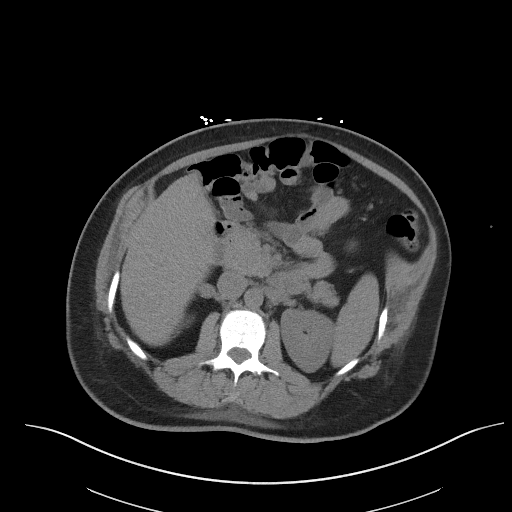
[im 66/94  soft-tissue]
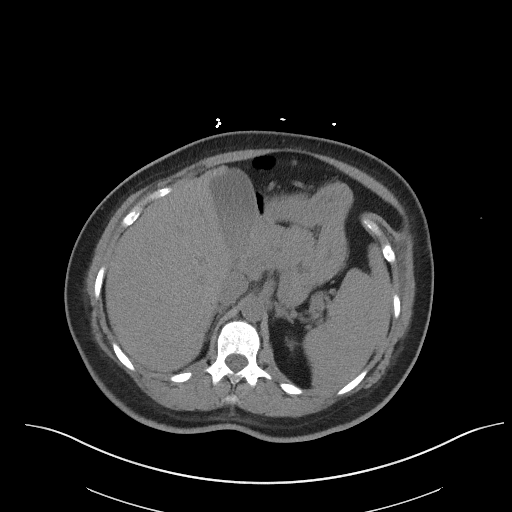
[im 66/94  bone]
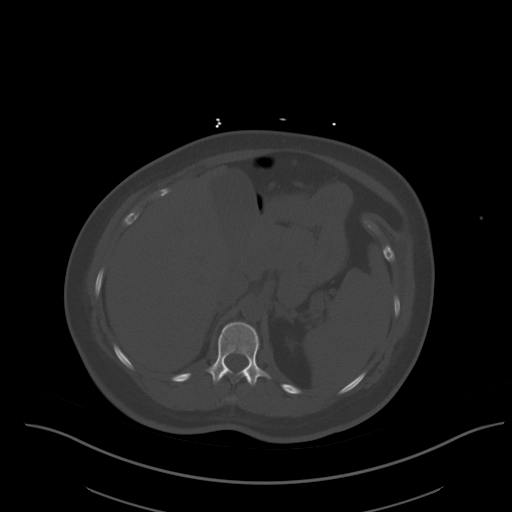
[im 74/94  soft-tissue]
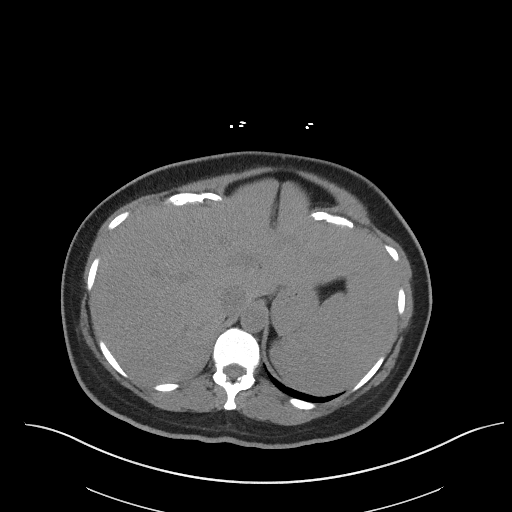
[im 82/94  soft-tissue]
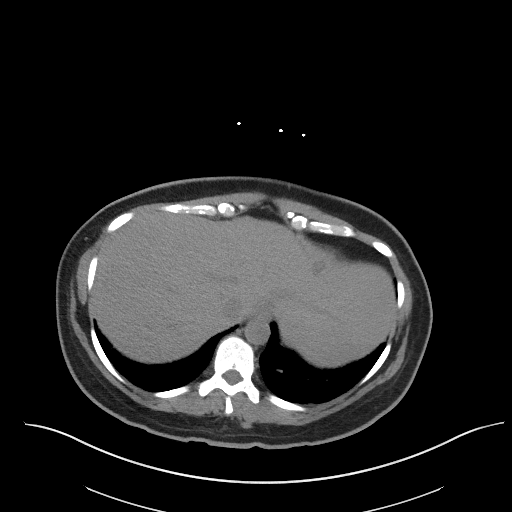
[im 90/94  soft-tissue]
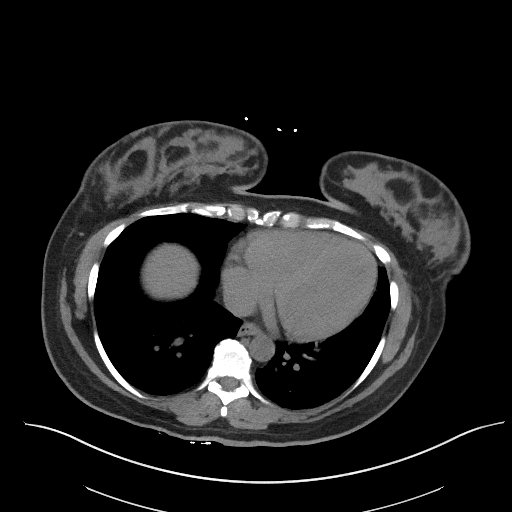

[Series 5: coronal st · coronal · 0.74mm/px · 3 of 96 slices shown]
[im 32/96  soft-tissue]
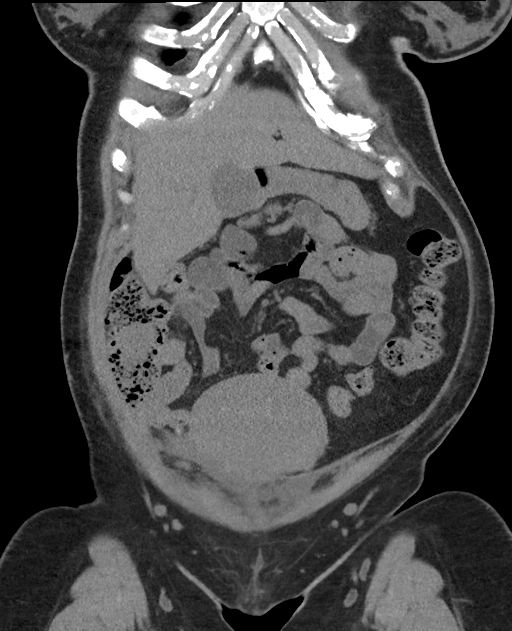
[im 43/96  soft-tissue]
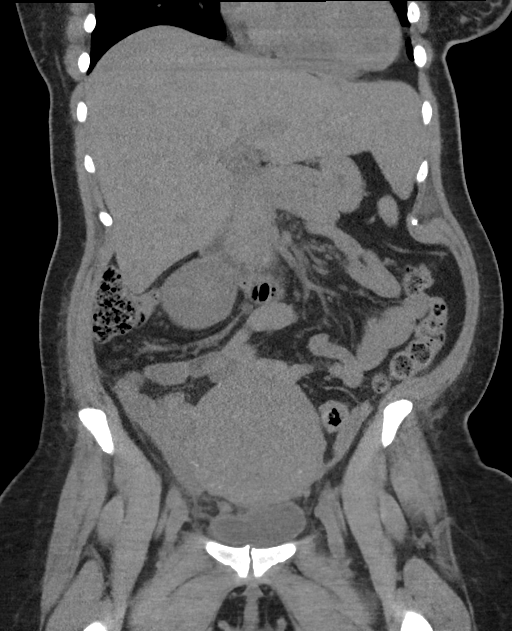
[im 53/96  soft-tissue]
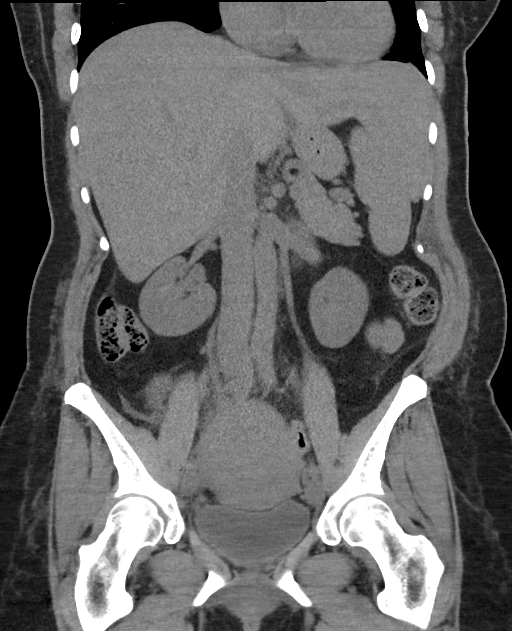

[15 of 46 positions shown; findings below may reference images not displayed]

FINDINGS: Lower chest: No significant pulmonary nodules or acute consolidative
airspace disease.

Hepatobiliary: Normal liver size. Hypodense 1.1 cm left liver lobe
lesion (series 2/image 14). No additional liver lesions. Normal
gallbladder with no radiopaque cholelithiasis. No biliary ductal
dilatation.

Pancreas: Normal, with no mass or duct dilation.

Spleen: Normal size. No mass.

Adrenals/Urinary Tract: Normal adrenals. No hydronephrosis. Punctate
nonobstructing upper right renal stone. No contour deforming renal
mass. Normal bladder.

Stomach/Bowel: Normal non-distended stomach. Normal caliber small
bowel with no small bowel wall thickening. Normal appendix. Normal
large bowel with no diverticulosis, large bowel wall thickening or
pericolonic fat stranding.

Vascular/Lymphatic: Normal caliber abdominal aorta. No
pathologically enlarged lymph nodes in the abdomen or pelvis.

Reproductive: Enlarged postpartum uterus. Trace fluid and fat
stranding anterior to the uterine body without focal measurable
fluid collection. No adnexal masses.

Other: No pneumoperitoneum, ascites or focal fluid collection.
Expected fat stranding at the transverse laparotomy spite in the
subcutaneous ventral pelvic wall with minimal subcutaneous emphysema
in the right ventral pelvic subcutaneous wall.

Musculoskeletal: No aggressive appearing focal osseous lesions. Mild
thoracolumbar spondylosis.
IMPRESSION: 1. Expected postsurgical changes from recent cesarean delivery. No
focal fluid collections.
2. No evidence of bowel obstruction or acute bowel inflammation.
Normal appendix.
3. Punctate nonobstructing right nephrolithiasis.
4. Indeterminate small low-attenuation left liver lobe lesion.
Outpatient MRI abdomen without and with IV contrast is recommended
for further characterization (please note that the MRI
abdomen/pelvis performed on 11/14/2017 during the patient's
pregnancy uses a different protocol and was not tailored for
evaluation of liver lesions. This lesion was not evident on the
11/14/2017 MRI study, although that study is not considered
diagnostic for evaluation of liver lesions).

## 2021-03-25 ENCOUNTER — Encounter: Payer: Self-pay | Admitting: General Surgery
